# Patient Record
Sex: Female | Born: 1995 | Race: White | Hispanic: No | Marital: Single | Smoking: Former smoker
Health system: Southern US, Community
[De-identification: ages and names within clinical notes are randomized; demographics above are authoritative.]

## PROBLEM LIST (undated history)

## (undated) ENCOUNTER — Inpatient Hospital Stay (HOSPITAL_COMMUNITY): Payer: Self-pay

## (undated) DIAGNOSIS — Z789 Other specified health status: Secondary | ICD-10-CM

## (undated) HISTORY — PX: NO PAST SURGERIES: SHX2092

---

## 2015-03-29 ENCOUNTER — Encounter: Payer: Self-pay | Admitting: Family Medicine

## 2015-03-29 ENCOUNTER — Ambulatory Visit (INDEPENDENT_AMBULATORY_CARE_PROVIDER_SITE_OTHER): Payer: 59 | Admitting: Family Medicine

## 2015-03-29 ENCOUNTER — Telehealth: Payer: Self-pay | Admitting: Family Medicine

## 2015-03-29 VITALS — BP 115/76 | HR 58 | Ht 64.0 in | Wt 198.0 lb

## 2015-03-29 DIAGNOSIS — R1013 Epigastric pain: Secondary | ICD-10-CM | POA: Diagnosis not present

## 2015-03-29 DIAGNOSIS — R1011 Right upper quadrant pain: Secondary | ICD-10-CM | POA: Diagnosis not present

## 2015-03-29 MED ORDER — ESOMEPRAZOLE MAGNESIUM 20 MG PO PACK: 20.0000 mg | PACK | Freq: Every day | ORAL | Status: DC

## 2015-03-29 MED ORDER — GI COCKTAIL ~~LOC~~
30.0000 mL | Freq: Once | ORAL | Status: AC
  Administered 2015-03-29: 30 mL via ORAL

## 2015-03-29 NOTE — Telephone Encounter (Signed)
[name], For some reason I can't see any records from [facility] through care everywhere on this patient.  Can you please see if [facility] can send us imaging and lab results from her visit to the ED in March.  Patient states that she also goes by "Abigail Trevino".

## 2015-03-29 NOTE — Progress Notes (Signed)
CC: Abigail Trevino is a 19 y.o. female is here for Establish Care and abdominal pain for years   Subjective: HPI:  Pleasant 19 year old here to establish care  Complains of abdominal pain that has been present for many years of her life however over the past month it's become much more unbearable. It fluctuates from mild to severe in severity over the course of the day. It's improved with avoiding eating and drinking. It's worse with bending forward and with food but not particularly with respect to the type of food. It's localized in the epigastric region and radiates into the back. It's described as a twisting sensation. Used to get better with Tums however this is becoming ineffective. Pain awoke her from her sleep at 2 AM last week and she went to a local emergency room she tells me she had a CT scan and blood work all of which was normal. She was also given Carafate that has mildly helped her pain. Hydrocodone helps her pain as well. No other interventions other than above. Review of systems is positive for a reflux sensation, vomiting, nausea and flushing.  Review of Systems - General ROS: negative for - chills, fever, night sweats, weight gain or weight loss Ophthalmic ROS: negative for - decreased vision Psychological ROS: negative for - anxiety or depression ENT ROS: negative for - hearing change, nasal congestion, tinnitus or allergies Hematological and Lymphatic ROS: negative for - bleeding problems, bruising or swollen lymph nodes Breast ROS: negative Respiratory ROS: no cough, shortness of breath, or wheezing Cardiovascular ROS: no chest pain or dyspnea on exertion Gastrointestinal ROS: no change in bowel habits, or black or bloody stools Genito-Urinary ROS: negative for - genital discharge, genital ulcers, incontinence or abnormal bleeding from genitals Musculoskeletal ROS: negative for - joint pain or muscle pain Neurological ROS: negative for - headaches or memory  loss Dermatological ROS: negative for lumps, mole changes, rash and skin lesion changes  History reviewed. No pertinent past medical history.  History reviewed. No pertinent past surgical history. Family History  Problem Relation Age of Onset  . Alcohol abuse      grandfather   . Lymphoma      grandfather     History   Social History  . Marital Status: Single    Spouse Name: N/A  . Number of Children: N/A  . Years of Education: N/A   Occupational History  . Not on file.   Social History Main Topics  . Smoking status: Current Every Day Smoker -- 0.50 packs/day for 2 years    Types: Cigarettes  . Smokeless tobacco: Not on file  . Alcohol Use: 0.6 oz/week    1 Standard drinks or equivalent per week  . Drug Use: Yes  . Sexual Activity:    Partners: Male   Other Topics Concern  . Not on file   Social History Narrative  . No narrative on file     Objective: BP 115/76 mmHg  Pulse 58  Ht  (1.626 m)  Wt 198 lb (89.812 kg)  BMI 33.97 kg/m2  LMP 03/25/2015  General: Alert and Oriented, No Acute Distress HEENT: Pupils equal, round, reactive to light. Conjunctivae clear.  Moist mucous membranes Lungs: Clear to auscultation bilaterally, no wheezing/ronchi/rales.  Comfortable work of breathing. Good air movement. Cardiac: Regular rate and rhythm. Normal S1/S2.  No murmurs, rubs, nor gallops.   Abdomen:obese and soft without guarding or rebound tenderness. No palpable masses. Pain is reproduced with palpation of the epigastric  region. No palpable hepatosplenomegaly. No umbilical hernia Extremities: No peripheral edema.  Strong peripheral pulses.  Mental Status: No depression, anxiety, nor agitation. Skin: Warm and dry.  Assessment & Plan: Abigail SparVictoria was seen today for establish care and abdominal pain for years.  Diagnoses and all orders for this visit:  Abdominal pain, epigastric Orders: -     esomeprazole (NEXIUM) 20 MG packet; Take 20 mg by mouth daily before  breakfast. -     gi cocktail (Maalox,Lidocaine,Donnatal); Take 30 mLs by mouth once.  RUQ pain Orders: -     US Abdomen Complete; Future -     gi cocktail (Maalox,Lidocaine,Donnatal); Take 30 mLs by mouth once.   My suspicion is that she suffered from gastritis, peptic ulcer disease or cholecystitis. I gave her a GI cocktail that I've instructed her to take at home when she feels that her pain is worsening.if this improves her pain within a few minutes then differential would point more to gastritis or ulcer, if not helpful she will then need to have an ultrasound of the gallbladder. If GI cocktail is helpful she can then start Nexium daily, samples were provided.  Return if symptoms worsen or fail to improve.

## 2015-03-30 ENCOUNTER — Ambulatory Visit (INDEPENDENT_AMBULATORY_CARE_PROVIDER_SITE_OTHER): Payer: 59

## 2015-03-30 DIAGNOSIS — R1013 Epigastric pain: Secondary | ICD-10-CM | POA: Diagnosis not present

## 2015-03-30 DIAGNOSIS — R1011 Right upper quadrant pain: Secondary | ICD-10-CM

## 2015-03-30 NOTE — Telephone Encounter (Signed)
Pt's mother states the gi cocktail did not help

## 2015-03-30 NOTE — Telephone Encounter (Signed)
Also can you ask her if she used the GI cocktail and whether or not it influenced her pain?

## 2015-03-30 NOTE — Telephone Encounter (Signed)
Noted, no change to the plan from result note for her US.

## 2016-02-07 IMAGING — US US ABDOMEN COMPLETE
1 series · 14 of 25 positions shown · non-contrast
Comparison: None.

CLINICAL DATA: Epigastric and right upper quadrant pain for the
past 2 weeks associated with nausea vomiting and diarrhea; symptoms
worse postprandially

EXAM:
ULTRASOUND ABDOMEN COMPLETE

[Series 1: us abdomen complete · 0.27mm/px · 14 of 159 slices shown]
[im 1/159]
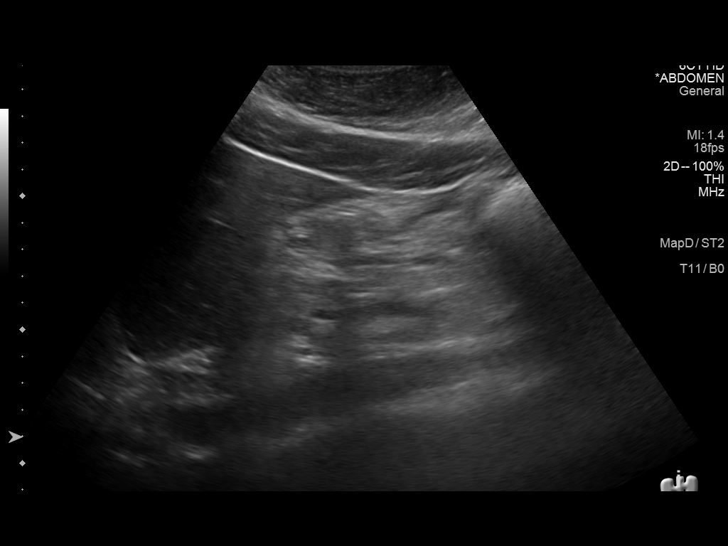
[im 14/159]
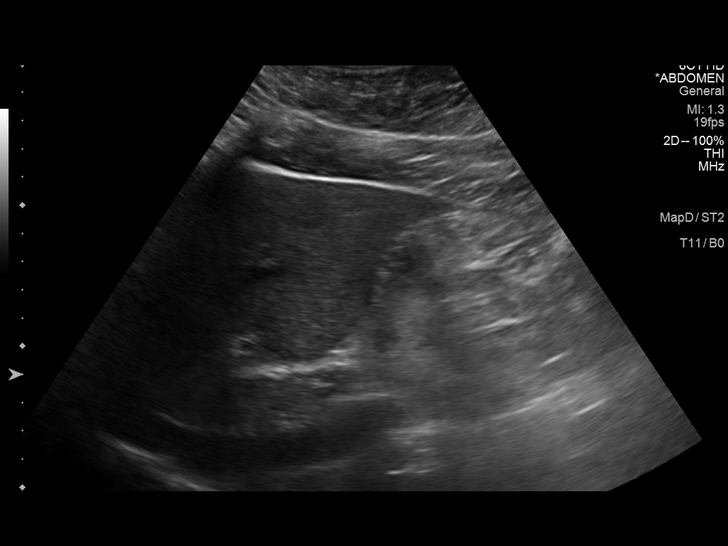
[im 27/159]
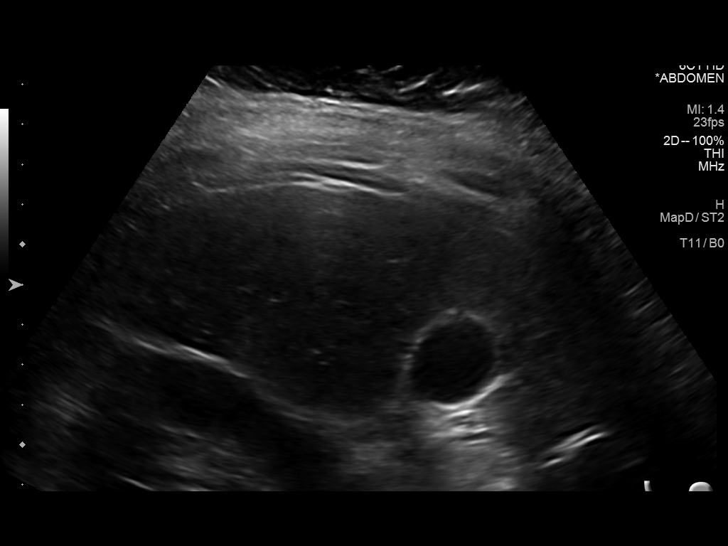
[im 40/159]
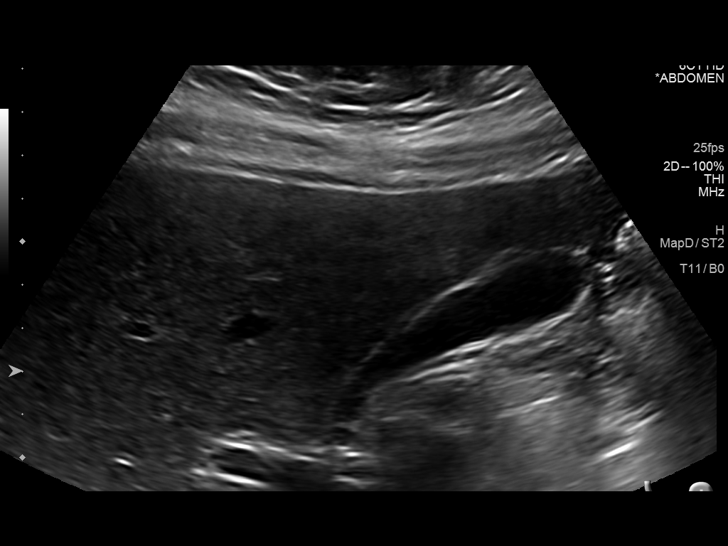
[im 53/159]
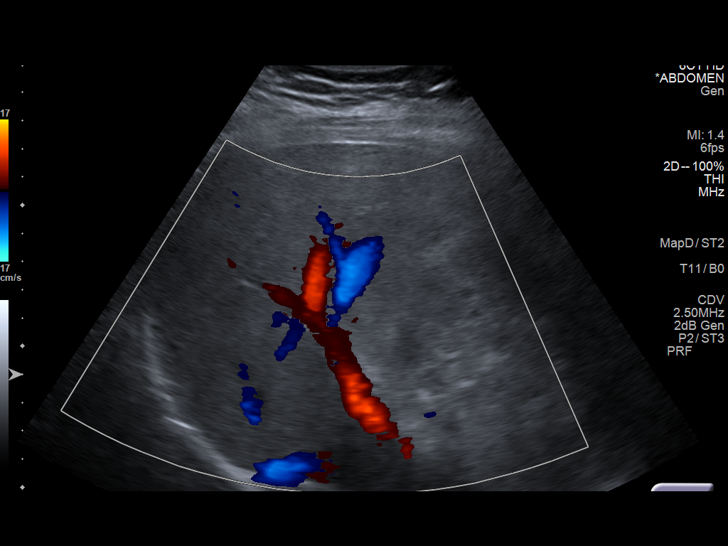
[im 60/159]
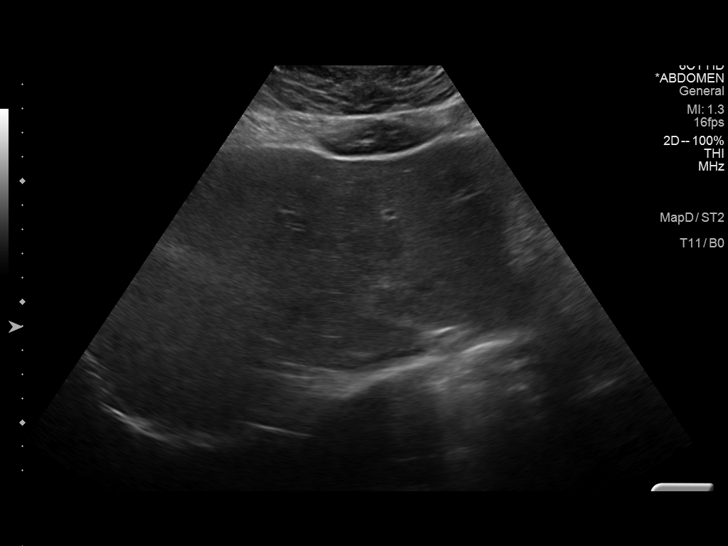
[im 73/159]
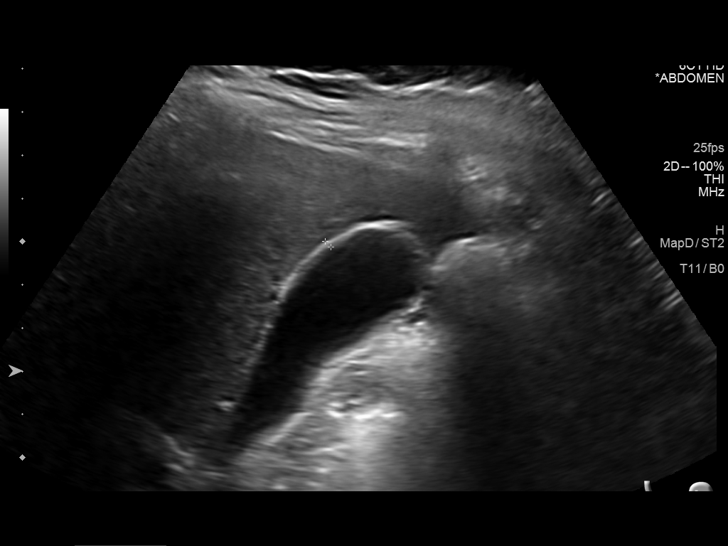
[im 86/159]
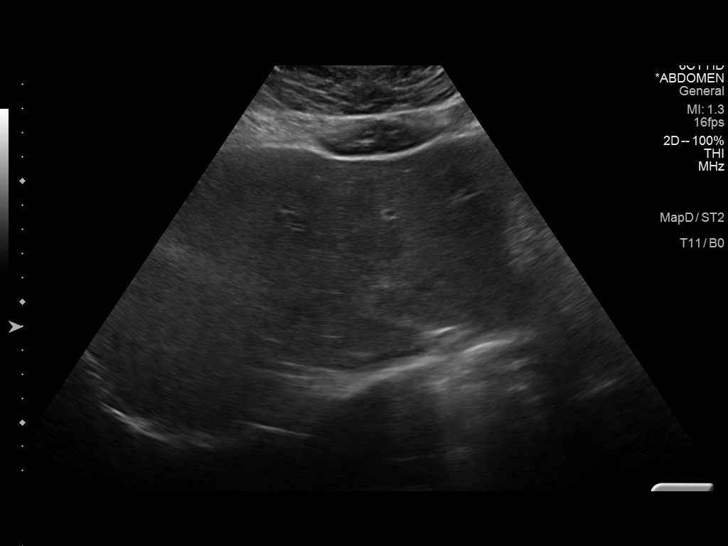
[im 99/159]
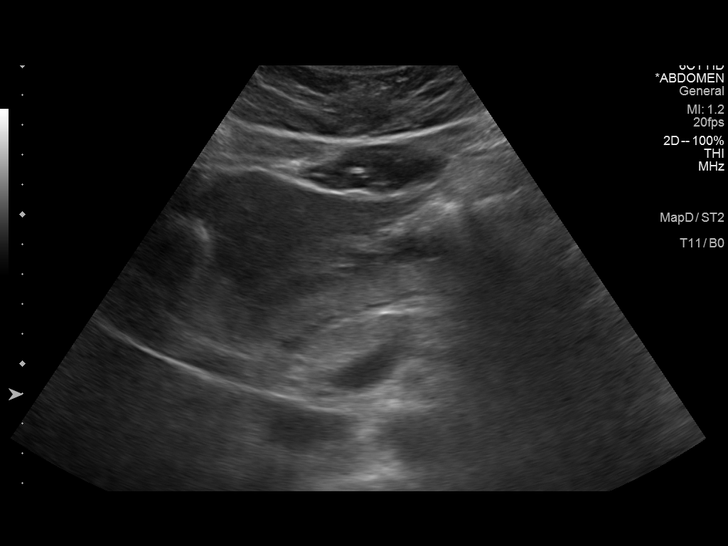
[im 106/159]
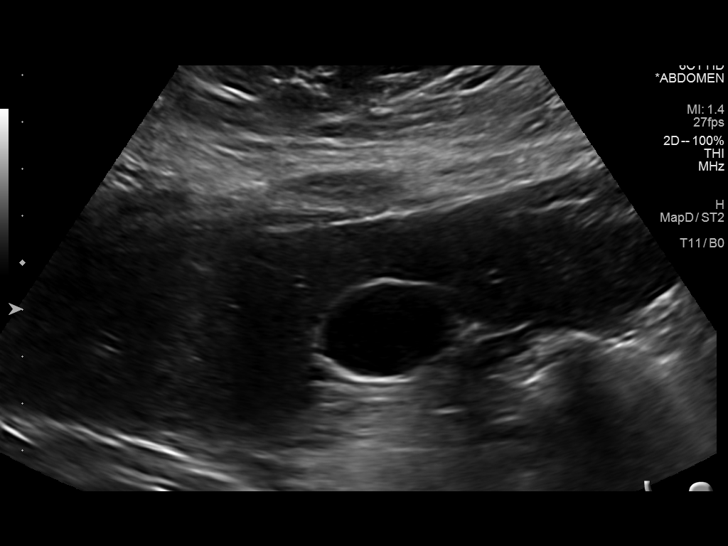
[im 119/159]
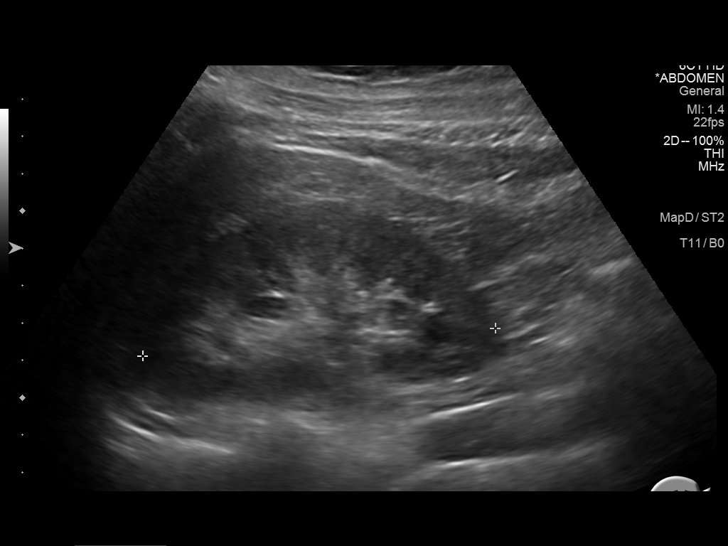
[im 132/159]
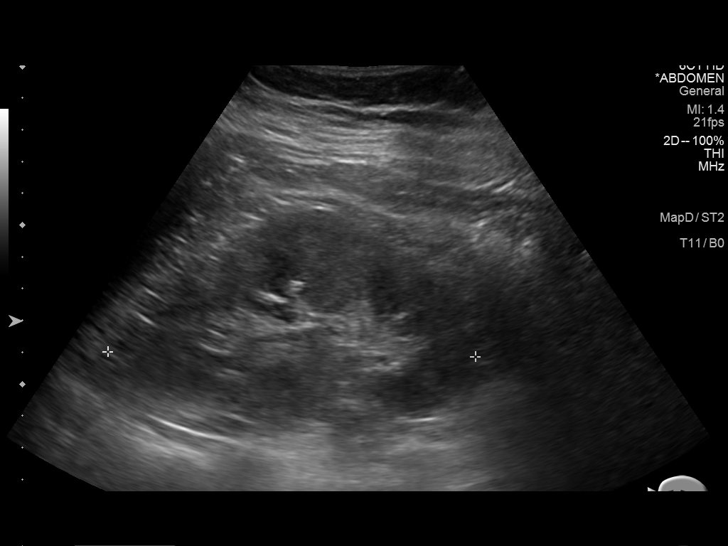
[im 145/159]
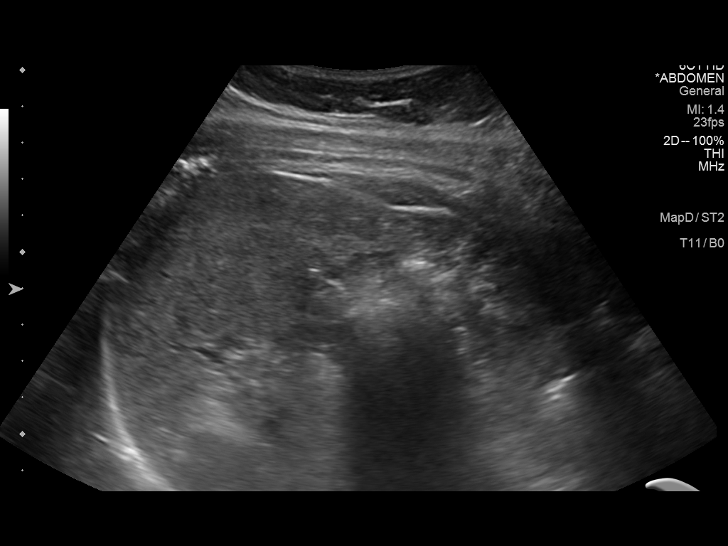
[im 159/159]
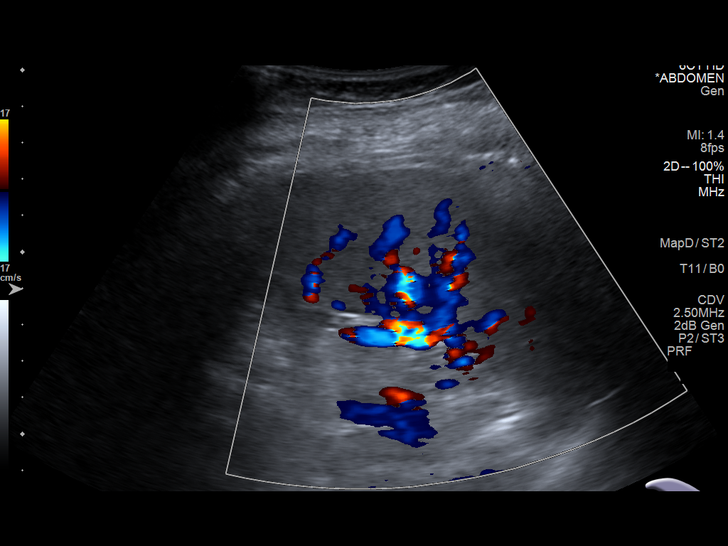

[14 of 25 positions shown; findings below may reference images not displayed]

FINDINGS: Gallbladder: No gallstones or wall thickening visualized. No
sonographic Murphy sign noted.

Common bile duct: Diameter: 2.4 mm

Liver: No focal lesion identified. Within normal limits in
parenchymal echogenicity. There is no intrahepatic ductal dilation.

IVC: Visualization is limited by bowel gas.

Pancreas: Only a small portion of the pancreatic body was visible
due to bowel gas.

Spleen: Size and appearance within normal limits.

Right Kidney: Length: 9.5 cm. Echogenicity within normal limits. No
mass or hydronephrosis visualized.

Left Kidney: Length: 11.6 cm. Echogenicity within normal limits. No
mass or hydronephrosis visualized.

Abdominal aorta: Limited visualization of the abdominal aorta
revealed no aneurysm.

Other findings: There is no ascites.
IMPRESSION: Overall the study is limited due to bowel gas. No acute abnormality
of the liver, gallbladder, or common bile duct is demonstrated.
Evaluation of the pancreas is limited.

## 2016-05-24 DIAGNOSIS — Z3401 Encounter for supervision of normal first pregnancy, first trimester: Secondary | ICD-10-CM | POA: Insufficient documentation

## 2016-09-29 LAB — OB RESULTS CONSOLE GC/CHLAMYDIA
CHLAMYDIA, DNA PROBE: NEGATIVE
GC PROBE AMP, GENITAL: NEGATIVE

## 2016-09-29 LAB — OB RESULTS CONSOLE RUBELLA ANTIBODY, IGM: Rubella: IMMUNE

## 2016-09-29 LAB — OB RESULTS CONSOLE ABO/RH: RH Type: POSITIVE

## 2016-09-29 LAB — OB RESULTS CONSOLE HEPATITIS B SURFACE ANTIGEN: Hepatitis B Surface Ag: NEGATIVE

## 2016-09-29 LAB — OB RESULTS CONSOLE HIV ANTIBODY (ROUTINE TESTING): HIV: NONREACTIVE

## 2016-09-29 LAB — OB RESULTS CONSOLE RPR: RPR: NONREACTIVE

## 2016-09-29 LAB — OB RESULTS CONSOLE ANTIBODY SCREEN: ANTIBODY SCREEN: NEGATIVE

## 2016-11-18 ENCOUNTER — Inpatient Hospital Stay (HOSPITAL_COMMUNITY)
Admission: AD | Admit: 2016-11-18 | Discharge: 2016-11-18 | Disposition: A | Discharge status: Home / Self Care | Payer: Medicaid Other | Source: Ambulatory Visit | Attending: Obstetrics and Gynecology | Admitting: Obstetrics and Gynecology

## 2016-11-18 ENCOUNTER — Encounter (HOSPITAL_COMMUNITY): Payer: Self-pay | Admitting: *Deleted

## 2016-11-18 DIAGNOSIS — R102 Pelvic and perineal pain: Secondary | ICD-10-CM

## 2016-11-18 DIAGNOSIS — R1031 Right lower quadrant pain: Secondary | ICD-10-CM | POA: Insufficient documentation

## 2016-11-18 DIAGNOSIS — Z3A31 31 weeks gestation of pregnancy: Secondary | ICD-10-CM | POA: Insufficient documentation

## 2016-11-18 DIAGNOSIS — O26899 Other specified pregnancy related conditions, unspecified trimester: Secondary | ICD-10-CM

## 2016-11-18 DIAGNOSIS — Z87891 Personal history of nicotine dependence: Secondary | ICD-10-CM | POA: Insufficient documentation

## 2016-11-18 DIAGNOSIS — O26893 Other specified pregnancy related conditions, third trimester: Secondary | ICD-10-CM | POA: Insufficient documentation

## 2016-11-18 HISTORY — DX: Other specified health status: Z78.9

## 2016-11-18 LAB — URINE MICROSCOPIC-ADD ON

## 2016-11-18 LAB — URINALYSIS, ROUTINE W REFLEX MICROSCOPIC
Bilirubin Urine: NEGATIVE
GLUCOSE, UA: NEGATIVE mg/dL
HGB URINE DIPSTICK: NEGATIVE
Ketones, ur: NEGATIVE mg/dL
Nitrite: NEGATIVE
PH: 6 (ref 5.0–8.0)
Protein, ur: NEGATIVE mg/dL
SPECIFIC GRAVITY, URINE: 1.025 (ref 1.005–1.030)

## 2016-11-18 MED ORDER — COMFORT FIT MATERNITY SUPP SM MISC: 1.0000 [IU] | Freq: Every day | 0 refills | Status: DC | PRN

## 2016-11-18 NOTE — Discharge Instructions (Signed)
Round Ligament Pain Introduction The round ligament is a cord of muscle and tissue that helps to support the uterus. It can become a source of pain during pregnancy if it becomes stretched or twisted as the baby grows. The pain usually begins in the second trimester of pregnancy, and it can come and go until the baby is delivered. It is not a serious problem, and it does not cause harm to the baby. Round ligament pain is usually a short, sharp, and pinching pain, but it can also be a dull, lingering, and aching pain. The pain is felt in the lower side of the abdomen or in the groin. It usually starts deep in the groin and moves up to the outside of the hip area. Pain can occur with:  A sudden change in position.  Rolling over in bed.  Coughing or sneezing.  Physical activity. Follow these instructions at home: Watch your condition for any changes. Take these steps to help with your pain:  When the pain starts, relax. Then try:  Sitting down.  Flexing your knees up to your abdomen.  Lying on your side with one pillow under your abdomen and another pillow between your legs.  Sitting in a warm bath for 15-20 minutes or until the pain goes away.  Take over-the-counter and prescription medicines only as told by your health care provider.  Move slowly when you sit and stand.  Avoid long walks if they cause pain.  Stop or lessen your physical activities if they cause pain. Contact a health care provider if:  Your pain does not go away with treatment.  You feel pain in your back that you did not have before.  Your medicine is not helping. Get help right away if:  You develop a fever or chills.  You develop uterine contractions.  You develop vaginal bleeding.  You develop nausea or vomiting.  You develop diarrhea.  You have pain when you urinate. This information is not intended to replace advice given to you by your health care provider. Make sure you discuss any questions  you have with your health care provider. Document Released: 09/19/2008 Document Revised: 05/18/2016 Document Reviewed: 02/17/2015  2017 Elsevier  

## 2016-11-18 NOTE — MAU Note (Signed)
having sharp pains in RLQ. No fever, diarrhea or vomiting.  Was working, happened suddenly, didn't go away after an hour. When she sits is ok, but when she stands or walks it hurts.

## 2016-11-18 NOTE — MAU Provider Note (Signed)
History     CSN: [id number]  Arrival date and time: 11/18/16 1150   First Provider Initiated Contact with Patient 11/18/16 1302        Chief Complaint  Patient presents with  . RLQ pain   HPI  Abigail Trevino is a 20 y.o. G1P0 at 2893w5d who presents with abdominal pain. Pain began this morning while at work. 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 Reports RLQ pain that is constant and sharp. Pain is improved when lying back & worsened with standing, walking, and changing positions. Rates pain 6/10. Has not treated. Denies vaginal bleeding, n/v/d, constipation, dysuria, LOF, or fever. Positive fetal movement.   OB History    Gravida Para Term Preterm AB Living   1             SAB TAB Ectopic Multiple Live Births                  Past Medical History:  Diagnosis Date  . Medical history non-contributory     Past Surgical History:  Procedure Laterality Date  . NO PAST SURGERIES      Family History  Problem Relation Age of Onset  . Alcohol abuse      grandfather   . Lymphoma      grandfather     Social History  Substance Use Topics  . Smoking status: Former Smoker    Packs/day: 0.50    Years: 2.00    Types: Cigarettes  . Smokeless tobacco: Never Used  . Alcohol use 0.6 oz/week    1 Standard drinks or equivalent per week    Allergies: No Known Allergies  Prescriptions Prior to Admission  Medication Sig Dispense Refill Last Dose  . Prenatal MV-Min-FA-Omega-3 (PRENATAL GUMMIES/DHA & FA) 0.4-32.5 MG CHEW Chew 2 capsules by mouth daily.   11/18/2016 at Unknown time    Review of Systems  Constitutional: Negative.   Gastrointestinal: Positive for abdominal pain. Negative for constipation, diarrhea, nausea and vomiting.  Genitourinary: Negative.   Musculoskeletal: Negative for back pain and falls.   Physical Exam   Blood pressure 111/64, pulse 78, temperature 98.1 F (36.7 C), temperature source Oral, resp. rate 18, weight 210 lb 9.6 oz  (95.5 kg).  Physical Exam  Nursing note and vitals reviewed. Constitutional: She is oriented to person, place, and time. She appears well-developed and well-nourished. No distress.  HENT:  Head: Normocephalic and atraumatic.  Eyes: Conjunctivae are normal. Right eye exhibits no discharge. Left eye exhibits no discharge. No scleral icterus.  Neck: Normal range of motion.  Cardiovascular: Normal rate, regular rhythm and normal heart sounds.   No murmur heard. Respiratory: Effort normal and breath sounds normal. No respiratory distress. She has no wheezes.  GI: Soft. Bowel sounds are normal. There is no tenderness.  Neurological: She is alert and oriented to person, place, and time.  Skin: Skin is warm and dry. She is not diaphoretic.  Psychiatric: She has a normal mood and affect. Her behavior is normal. Judgment and thought content normal.   Dilation: Closed Exam by:: Judeth HornErin Lashonda Sonneborn, NP  Fetal Tracing:  Baseline: 125 Variability: moderate Accelerations: 15x15 Decelerations: none   Toco: irregular ctx & some UI  MAU Course  Procedures Results for orders placed or performed during the hospital encounter of 11/18/16 (from the past 24 hour(s))  Urinalysis, Routine w reflex microscopic (not at [facility])     Status: Abnormal   Collection Time: 11/18/16 12:06 PM  Result Value  Ref Range   Color, Urine YELLOW YELLOW   APPearance CLEAR CLEAR   Specific Gravity, Urine 1.025 1.005 - 1.030   pH 6.0 5.0 - 8.0   Glucose, UA NEGATIVE NEGATIVE mg/dL   Hgb urine dipstick NEGATIVE NEGATIVE   Bilirubin Urine NEGATIVE NEGATIVE   Ketones, ur NEGATIVE NEGATIVE mg/dL   Protein, ur NEGATIVE NEGATIVE mg/dL   Nitrite NEGATIVE NEGATIVE   Leukocytes, UA SMALL (A) NEGATIVE  Urine microscopic-add on     Status: Abnormal   Collection Time: 11/18/16 12:06 PM  Result Value Ref Range   Squamous Epithelial / LPF 6-30 (A) NONE SEEN   WBC, UA 0-5 0 - 5 WBC/hpf   RBC / HPF 0-5 0 - 5 RBC/hpf   Bacteria, UA  MANY (A) NONE SEEN    MDM Category 1 tracing Cervix closed U/a - contaminated specimen S/w Dr. Tenny Crawoss; ok to discharge home  Assessment and Plan  A; 1. Pain of round ligament during pregnancy    P: Discharge home Rx maternity support belt Discussed reasons to return to MAU Keep f/u with OB or call if symptoms worsen  Judeth Hornrin Zen Cedillos 11/18/2016, 1:01 PM

## 2016-12-15 ENCOUNTER — Encounter: Payer: Self-pay | Admitting: Family

## 2016-12-15 ENCOUNTER — Ambulatory Visit (INDEPENDENT_AMBULATORY_CARE_PROVIDER_SITE_OTHER): Payer: Medicaid Other | Admitting: Family

## 2016-12-15 VITALS — BP 120/77 | HR 81 | Wt 212.0 lb

## 2016-12-15 DIAGNOSIS — Z3403 Encounter for supervision of normal first pregnancy, third trimester: Secondary | ICD-10-CM

## 2016-12-15 DIAGNOSIS — Z3400 Encounter for supervision of normal first pregnancy, unspecified trimester: Secondary | ICD-10-CM | POA: Insufficient documentation

## 2016-12-15 LAB — OB RESULTS CONSOLE GBS: STREP GROUP B AG: POSITIVE

## 2016-12-15 NOTE — Patient Instructions (Signed)
AREA PEDIATRIC/FAMILY PRACTICE PHYSICIANS  [facility] [address], [address] [place], [place]  [address] Phone - [phone]   Fax - [phone]  [facility] [address] [place], [place] [address] Phone - [phone]   Fax - [phone]  [facility] [address] [place], [place]  [address] Phone - [phone]   Fax - [phone]  [facility] [address] [place], [place]  [address] Phone - [phone]   Fax - [phone]  [facility] [address] [place], [place]  [address] Phone - [phone]   Fax - [phone]  [facility] [address], [address] [place], [place]  [address] Phone - [phone]   Fax - [phone]  [facility] [address] [place], [place]  [address] Phone - [phone]   Fax - [phone]  [facility] [address] [place], [place]  [address] Phone - [phone]   Fax - [phone]  [facility] [address] [place], [place]  [address] Phone - [phone]   Fax - [phone] [facility] [address] [place], [place]  [address] Phone - [phone]   Fax - [phone]  [facility] [address] [place], [place]  [address] Phone - [phone]   Fax - [phone]  [facility] [address] [place], [place]  [address] Phone - [phone]   Fax - [phone]  [facility] [address], [address] [place], [place]  [address] Phone - [phone]   Fax - [phone]  [facility] [address] [place], [place]  [address] Phone - [phone]  [facility] [address] [place], [place]  [address] Phone - [phone]   Fax - [phone]  [facility] [address] [place], [place]  [address] Phone - [phone]   Fax - [phone]  [facility] [address] [place], [place]  [address] Phone - [phone]   Fax - [phone]  [facility] [address] [place], [place]  [address] Phone - [phone]   Fax - [phone]   [facility] [address] [place], [place]  [address] Phone - [phone]   Fax - [phone]  [facility] [address] [place], [place]  [address] Phone - [phone]   Fax - [phone]  [facility] [address] [place], [place]  [address] Phone - [phone]   Fax - [phone]  [facility] [address] [place], [place]  [address] Phone - [phone]   Fax - [phone]  [facility] [address] , [place]  [address] Phone - [phone]   Fax - [phone] [facility] [address] [place], [place]  [address] Phone - [phone]   Fax - [phone]  [facility] [address] [place],   [address] Phone - [phone]   Fax - [phone]  [facility] [address] [place],   [address] Phone - [phone]   Fax - [phone]   

## 2016-12-15 NOTE — Progress Notes (Signed)
  Subjective:    Abigail Trevino is a G1P0 1728w4d being seen today for her first obstetrical visit.  Her obstetrical history is significant for first time pregnancy with no complications.  [facility].  Received adequate care and all testing.  Attended waterbirth class last week. Patient does intend to breast feed. Pregnancy history fully reviewed.  Patient reports intermittent pressure.  Vitals:   12/15/16 0815  BP: 120/77  Pulse: 81  Weight: 212 lb (96.2 kg)    HISTORY: OB History  Gravida Para Term Preterm AB Living  1            SAB TAB Ectopic Multiple Live Births               # Outcome Date GA Lbr Len/2nd Weight Sex Delivery Anes PTL Lv  1 Current              Past Medical History:  Diagnosis Date  . Medical history non-contributory    Past Surgical History:  Procedure Laterality Date  . NO PAST SURGERIES     Family History  Problem Relation Age of Onset  . Alcohol abuse      grandfather   . Lymphoma      grandfather   . Fibromyalgia Maternal Grandmother   . Lymphoma Maternal Grandfather   . Heart disease Maternal Grandfather      Exam   Vitals:   12/15/16 0815  BP: 120/77  Pulse: 81  Weight: 212 lb (96.2 kg)    Fetal Status: Fetal Heart Rate (bpm): 143   Movement: Present     General:  Alert, oriented and cooperative. Patient is in no acute distress.  Skin: Skin is warm and dry. No rash noted.   Cardiovascular: Normal heart rate noted  Respiratory: Normal respiratory effort, no problems with respiration noted  Abdomen: Soft, gravid, appropriate for gestational age. Pain/Pressure: Present     Pelvic: Vag. Bleeding: None Vag D/C Character: White   Cervical exam performed      closed/thick  Extremities: Normal range of motion.  Edema: Trace  Mental Status: Normal mood and affect. Normal behavior. Normal judgment and thought content.   Urinalysis:       Assessment:    Pregnancy: G1P0 Patient  Active Problem List   Diagnosis Date Noted  . Supervision of low-risk first pregnancy 12/15/2016        Plan:     Prenatal records reviewed.  GBS and GC/CT collected Signed waterbirth consent today and reviewed contraindications if developed later in pregnancy including the following:  Preterm birth less than 37 weeks, thick, particulate meconium stained fluid, Maternal fever over 101, heavy bleeding or signs of placental abruption, pre-eclampsia, abnormal fetal heart rate pattern, breech presentation, active communicable infection (this does NOT include group B strep), significant limitation to mobility, or any other condition per provider discretion. Prenatal vitamins. Problem list reviewed and updated.  Follow up in 1 weeks.  Eino FarberWalidah Elenora FenderKarim 12/15/2016

## 2016-12-15 NOTE — Addendum Note (Signed)
Addended by: Kathie DikeSOLA, Ryan Palermo J on: 12/15/2016 09:48 AM   Modules accepted: Orders

## 2016-12-16 LAB — CULTURE, BETA STREP (GROUP B ONLY)

## 2016-12-22 ENCOUNTER — Encounter: Payer: Self-pay | Admitting: Advanced Practice Midwife

## 2016-12-22 ENCOUNTER — Ambulatory Visit (INDEPENDENT_AMBULATORY_CARE_PROVIDER_SITE_OTHER): Payer: Medicaid Other | Admitting: Advanced Practice Midwife

## 2016-12-22 ENCOUNTER — Other Ambulatory Visit (HOSPITAL_COMMUNITY)
Admission: RE | Admit: 2016-12-22 | Discharge: 2016-12-22 | Disposition: A | Discharge status: Home / Self Care | Payer: Medicaid Other | Source: Ambulatory Visit | Attending: Advanced Practice Midwife | Admitting: Advanced Practice Midwife

## 2016-12-22 VITALS — BP 110/72 | HR 84 | Wt 214.0 lb

## 2016-12-22 DIAGNOSIS — Z3403 Encounter for supervision of normal first pregnancy, third trimester: Secondary | ICD-10-CM

## 2016-12-22 DIAGNOSIS — Z3492 Encounter for supervision of normal pregnancy, unspecified, second trimester: Secondary | ICD-10-CM

## 2016-12-22 DIAGNOSIS — Z113 Encounter for screening for infections with a predominantly sexual mode of transmission: Secondary | ICD-10-CM | POA: Insufficient documentation

## 2016-12-22 DIAGNOSIS — O99820 Streptococcus B carrier state complicating pregnancy: Secondary | ICD-10-CM | POA: Insufficient documentation

## 2016-12-22 NOTE — Patient Instructions (Signed)

## 2016-12-22 NOTE — Progress Notes (Signed)
   PRENATAL VISIT NOTE  Subjective:  Abigail Trevino is a 20 y.o. G1P0 at 3077w4d being seen today for ongoing prenatal care.  She is currently monitored for the following issues for this low-risk pregnancy and has Supervision of low-risk first pregnancy; Prenatal care, first pregnancy in first trimester; and Group B streptococcal carriage complicating pregnancy on her problem list.  Patient reports no complaints.  Contractions: Irritability. Vag. Bleeding: None.  Movement: Present. Denies leaking of fluid.   The following portions of the patient's history were reviewed and updated as appropriate: allergies, current medications, past family history, past medical history, past social history, past surgical history and problem list. Problem list updated.  Objective:   Vitals:   12/22/16 0823  BP: 110/72  Pulse: 84  Weight: 214 lb (97.1 kg)    Fetal Status: Fetal Heart Rate (bpm): 143   Movement: Present     General:  Alert, oriented and cooperative. Patient is in no acute distress.  Skin: Skin is warm and dry. No rash noted.   Cardiovascular: Normal heart rate noted  Respiratory: Normal respiratory effort, no problems with respiration noted  Abdomen: Soft, gravid, appropriate for gestational age. Pain/Pressure: Present     Pelvic:  Cervical exam deferred        Extremities: Normal range of motion.  Edema: Trace  Mental Status: Normal mood and affect. Normal behavior. Normal judgment and thought content.   Assessment and Plan:  Pregnancy: G1P0 at 3577w4d  1. Normal pregnancy in second trimester      Reviewed waterbirth info. Has had class but certificate not in system, scanned today  - Urine cytology ancillary only  2. Group B streptococcal carriage complicating pregnancy     Plan PCN in labor, pt notified  Term labor symptoms and general obstetric precautions including but not limited to vaginal bleeding, contractions, leaking of fluid and fetal movement were reviewed in detail  with the patient. Please refer to After Visit Summary for other counseling recommendations.  Return in about 1 week (around 12/29/2016) [place] [facility] [facility].   Aviva SignsMarie L Williams, CNM

## 2016-12-25 NOTE — L&D Delivery Note (Signed)
Operative Delivery Note At 9:16 AM a viable female was delivered via Vaginal, Spontaneous Delivery after 1 minute shoulder dystocia.  Presentation: vertex; Position: Occiput,, Anterior  Delivery of the head: 01/17/2017  9:15 AM First maneuver: McRoberts Second maneuver: Suprapubic Pressure Third maneuver:  Woodscrew Fourth maneuver: Reverse Woodscrew Fifth maneuver:  N/A Sixth maneuver: N/A  Total time of shoulder dystocia: 1 minute  APGAR: 7, 9; weight is pending.   Placenta status: Intact, 3VC, spontaneously delivered Cord: 3 vessel, with the following complications: None.   Cord pH: 7.279  Anesthesia:   Epidural Episiotomy: None Lacerations: Vaginal;Labial Suture Repair: 3.0 vicryl 4.0 Monocryl Est. Blood Loss (mL): 250  Mom to postpartum.  Baby to Couplet care / Skin to Skin.  Jen MowElizabeth Tan Clopper, DO OB Fellow 01/17/2017, 10:37 AM

## 2016-12-26 LAB — URINE CYTOLOGY ANCILLARY ONLY
Chlamydia: NEGATIVE
NEISSERIA GONORRHEA: NEGATIVE

## 2016-12-29 ENCOUNTER — Ambulatory Visit (INDEPENDENT_AMBULATORY_CARE_PROVIDER_SITE_OTHER): Payer: Medicaid Other | Admitting: Family

## 2016-12-29 ENCOUNTER — Encounter: Payer: Self-pay | Admitting: Family

## 2016-12-29 VITALS — BP 121/77 | HR 79 | Wt 219.0 lb

## 2016-12-29 DIAGNOSIS — Z3403 Encounter for supervision of normal first pregnancy, third trimester: Secondary | ICD-10-CM

## 2016-12-29 NOTE — Progress Notes (Signed)
   PRENATAL VISIT NOTE  Subjective:  Levert FeinsteinVictoria Trevino is a 21 y.o. G1P0 at 733w4d being seen today for ongoing prenatal care.  She is currently monitored for the following issues for this low-risk pregnancy and has Supervision of low-risk first pregnancy; Prenatal care, first pregnancy in first trimester; and Group B streptococcal carriage complicating pregnancy on her problem list.  Patient reports no complaints.  Contractions: Irritability. Vag. Bleeding: None.  Movement: Present. Denies leaking of fluid.   The following portions of the patient's history were reviewed and updated as appropriate: allergies, current medications, past family history, past medical history, past social history, past surgical history and problem list. Problem list updated.  Objective:   Vitals:   12/29/16 0818  BP: 121/77  Pulse: 79  Weight: 219 lb (99.3 kg)    Fetal Status: Fetal Heart Rate (bpm): 140   Movement: Present     General:  Alert, oriented and cooperative. Patient is in no acute distress.  Skin: Skin is warm and dry. No rash noted.   Cardiovascular: Normal heart rate noted  Respiratory: Normal respiratory effort, no problems with respiration noted  Abdomen: Soft, gravid, appropriate for gestational age. Pain/Pressure: Present     Pelvic:  Cervical exam deferred        Extremities: Normal range of motion.  Edema: Trace  Mental Status: Normal mood and affect. Normal behavior. Normal judgment and thought content.   Assessment and Plan:  Pregnancy: G1P0 at 573w4d  1. Encounter for supervision of low-risk first pregnancy in third trimester - Reviewed GBS results - Discussed signs of labor - Discussed stages of cervical change (softning, effacement, dilation)  Term labor symptoms and general obstetric precautions including but not limited to vaginal bleeding, contractions, leaking of fluid and fetal movement were reviewed in detail with the patient. Please refer to After Visit Summary for  other counseling recommendations.  Return in about 1 week (around 01/05/2017).   Eino FarberWalidah Kennith GainN Karim, CNM

## 2017-01-03 ENCOUNTER — Ambulatory Visit (INDEPENDENT_AMBULATORY_CARE_PROVIDER_SITE_OTHER): Payer: Medicaid Other | Admitting: Obstetrics & Gynecology

## 2017-01-03 DIAGNOSIS — Z3403 Encounter for supervision of normal first pregnancy, third trimester: Secondary | ICD-10-CM

## 2017-01-03 NOTE — Progress Notes (Signed)
   PRENATAL VISIT NOTE  Subjective:  Abigail Trevino is a 21 y.o. G1P0 at 910w2d being seen today for ongoing prenatal care.  She is currently monitored for the following issues for this low-risk pregnancy and has Supervision of low-risk first pregnancy; Prenatal care, first pregnancy in first trimester; and Group B streptococcal carriage complicating pregnancy on her problem list.  Patient reports no complaints.  Contractions: Irritability. Vag. Bleeding: None.  Movement: Present. Denies leaking of fluid.   The following portions of the patient's history were reviewed and updated as appropriate: allergies, current medications, past family history, past medical history, past social history, past surgical history and problem list. Problem list updated.  Objective:   Vitals:   01/03/17 1014  BP: 122/79  Pulse: 93  Weight: 220 lb (99.8 kg)    Fetal Status: Fetal Heart Rate (bpm): 143 Fundal Height: 38 cm Movement: Present     General:  Alert, oriented and cooperative. Patient is in no acute distress.  Skin: Skin is warm and dry. No rash noted.   Cardiovascular: Normal heart rate noted  Respiratory: Normal respiratory effort, no problems with respiration noted  Abdomen: Soft, gravid, appropriate for gestational age. Pain/Pressure: Present     Pelvic:  Cervical exam performed Dilation: Closed Effacement (%): 50 Station: Ballotable  Extremities: Normal range of motion.  Edema: Trace  Mental Status: Normal mood and affect. Normal behavior. Normal judgment and thought content.   Assessment and Plan:  Pregnancy: G1P0 at 3510w2d  1. Encounter for supervision of low-risk first pregnancy in third trimester -GBS positive--treat in labor  Term labor symptoms and general obstetric precautions including but not limited to vaginal bleeding, contractions, leaking of fluid and fetal movement were reviewed in detail with the patient. Please refer to After Visit Summary for other counseling  recommendations.   RTC 1 week.  Lesly DukesKelly H Geron Mulford, MD

## 2017-01-05 ENCOUNTER — Encounter: Payer: Medicaid Other | Admitting: Advanced Practice Midwife

## 2017-01-10 ENCOUNTER — Encounter: Payer: Medicaid Other | Admitting: Obstetrics & Gynecology

## 2017-01-15 ENCOUNTER — Inpatient Hospital Stay (HOSPITAL_COMMUNITY)
Admission: AD | Admit: 2017-01-15 | Discharge: 2017-01-18 | DRG: 775 | Disposition: A | Discharge status: Home / Self Care | Payer: Medicaid Other | Source: Ambulatory Visit | Attending: Family Medicine | Admitting: Family Medicine

## 2017-01-15 ENCOUNTER — Ambulatory Visit (INDEPENDENT_AMBULATORY_CARE_PROVIDER_SITE_OTHER): Payer: Medicaid Other | Admitting: Obstetrics & Gynecology

## 2017-01-15 ENCOUNTER — Inpatient Hospital Stay (HOSPITAL_COMMUNITY): Payer: Medicaid Other

## 2017-01-15 ENCOUNTER — Encounter: Payer: Self-pay | Admitting: Obstetrics & Gynecology

## 2017-01-15 ENCOUNTER — Encounter (HOSPITAL_COMMUNITY): Payer: Self-pay | Admitting: *Deleted

## 2017-01-15 VITALS — BP 116/77 | HR 88 | Wt 223.0 lb

## 2017-01-15 DIAGNOSIS — Z3403 Encounter for supervision of normal first pregnancy, third trimester: Secondary | ICD-10-CM

## 2017-01-15 DIAGNOSIS — Z8249 Family history of ischemic heart disease and other diseases of the circulatory system: Secondary | ICD-10-CM

## 2017-01-15 DIAGNOSIS — Z87891 Personal history of nicotine dependence: Secondary | ICD-10-CM

## 2017-01-15 DIAGNOSIS — O99824 Streptococcus B carrier state complicating childbirth: Secondary | ICD-10-CM | POA: Diagnosis present

## 2017-01-15 DIAGNOSIS — O368390 Maternal care for abnormalities of the fetal heart rate or rhythm, unspecified trimester, not applicable or unspecified: Secondary | ICD-10-CM

## 2017-01-15 DIAGNOSIS — Z3A40 40 weeks gestation of pregnancy: Secondary | ICD-10-CM | POA: Diagnosis not present

## 2017-01-15 DIAGNOSIS — O288 Other abnormal findings on antenatal screening of mother: Secondary | ICD-10-CM

## 2017-01-15 LAB — CBC
HEMATOCRIT: 34.5 % — AB (ref 36.0–46.0)
HEMOGLOBIN: 11.6 g/dL — AB (ref 12.0–15.0)
MCH: 28.1 pg (ref 26.0–34.0)
MCHC: 33.6 g/dL (ref 30.0–36.0)
MCV: 83.5 fL (ref 78.0–100.0)
Platelets: 226 10*3/uL (ref 150–400)
RBC: 4.13 MIL/uL (ref 3.87–5.11)
RDW: 14.3 % (ref 11.5–15.5)
WBC: 8.9 10*3/uL (ref 4.0–10.5)

## 2017-01-15 LAB — TYPE AND SCREEN
ABO/RH(D): O POS
ANTIBODY SCREEN: NEGATIVE

## 2017-01-15 MED ORDER — TERBUTALINE SULFATE 1 MG/ML IJ SOLN
0.2500 mg | Freq: Once | INTRAMUSCULAR | Status: DC | PRN
  Filled 2017-01-15: qty 1

## 2017-01-15 MED ORDER — ACETAMINOPHEN 325 MG PO TABS: 650.0000 mg | ORAL_TABLET | ORAL | Status: DC | PRN

## 2017-01-15 MED ORDER — OXYTOCIN 40 UNITS IN LACTATED RINGERS INFUSION - SIMPLE MED: 2.5000 [IU]/h | INTRAVENOUS | Status: DC

## 2017-01-15 MED ORDER — PENICILLIN G POTASSIUM 5000000 UNITS IJ SOLR
5.0000 10*6.[IU] | Freq: Once | INTRAVENOUS | Status: AC
  Administered 2017-01-16: 5 10*6.[IU] via INTRAVENOUS
  Filled 2017-01-15: qty 5

## 2017-01-15 MED ORDER — OXYTOCIN 40 UNITS IN LACTATED RINGERS INFUSION - SIMPLE MED
1.0000 m[IU]/min | INTRAVENOUS | Status: DC
  Administered 2017-01-15: 1 m[IU]/min via INTRAVENOUS
  Administered 2017-01-16: 6 m[IU]/min via INTRAVENOUS
  Filled 2017-01-15 (×2): qty 1000

## 2017-01-15 MED ORDER — FENTANYL CITRATE (PF) 100 MCG/2ML IJ SOLN
100.0000 ug | INTRAMUSCULAR | Status: DC | PRN
  Administered 2017-01-15 – 2017-01-16 (×13): 100 ug via INTRAVENOUS
  Filled 2017-01-15 (×15): qty 2

## 2017-01-15 MED ORDER — LACTATED RINGERS IV SOLN
INTRAVENOUS | Status: DC
  Administered 2017-01-16 – 2017-01-17 (×3): via INTRAVENOUS

## 2017-01-15 MED ORDER — SOD CITRATE-CITRIC ACID 500-334 MG/5ML PO SOLN
30.0000 mL | ORAL | Status: DC | PRN
  Administered 2017-01-16 (×2): 30 mL via ORAL
  Filled 2017-01-15 (×2): qty 15

## 2017-01-15 MED ORDER — PENICILLIN G POT IN DEXTROSE 60000 UNIT/ML IV SOLN
3.0000 10*6.[IU] | INTRAVENOUS | Status: DC
  Administered 2017-01-16 – 2017-01-17 (×5): 3 10*6.[IU] via INTRAVENOUS
  Filled 2017-01-15 (×8): qty 50

## 2017-01-15 MED ORDER — LIDOCAINE HCL (PF) 1 % IJ SOLN
30.0000 mL | INTRAMUSCULAR | Status: DC | PRN
  Administered 2017-01-17: 30 mL via SUBCUTANEOUS
  Filled 2017-01-15: qty 30

## 2017-01-15 MED ORDER — LACTATED RINGERS IV SOLN: 500.0000 mL | INTRAVENOUS | Status: DC | PRN

## 2017-01-15 MED ORDER — OXYCODONE-ACETAMINOPHEN 5-325 MG PO TABS: 2.0000 | ORAL_TABLET | ORAL | Status: DC | PRN

## 2017-01-15 MED ORDER — OXYTOCIN BOLUS FROM INFUSION
500.0000 mL | Freq: Once | INTRAVENOUS | Status: AC
Start: 2017-01-15 — End: 2017-01-17
  Administered 2017-01-17: 500 mL via INTRAVENOUS

## 2017-01-15 MED ORDER — OXYCODONE-ACETAMINOPHEN 5-325 MG PO TABS: 1.0000 | ORAL_TABLET | ORAL | Status: DC | PRN

## 2017-01-15 MED ORDER — MISOPROSTOL 25 MCG QUARTER TABLET: 25.0000 ug | ORAL_TABLET | ORAL | Status: DC | PRN

## 2017-01-15 MED ORDER — ONDANSETRON HCL 4 MG/2ML IJ SOLN
4.0000 mg | Freq: Four times a day (QID) | INTRAMUSCULAR | Status: DC | PRN
Start: 2017-01-15 — End: 2017-01-17

## 2017-01-15 NOTE — H&P (Signed)
Abigail FeinsteinVictoria Trevino is a 21 y.o. female G1P0 at 7363w0d who presents to maternity admissions sent from the office for prolonged decleration on NST.  She had 6/8 BPP and another deceleration noted during ultrasound.  She is admitted for IOL for nonreactive NST.    She has no other risk factors and no pregnancy complications.  She reports good fetal movement, denies regular contractions, LOF, vaginal bleeding, vaginal itching/burning, urinary symptoms, h/a, dizziness, n/v, or fever/chills.    She planned waterbirth but is aware that she cannot get in tub if continuous monitoring is required.  Clinic  [place] > transfer f/[place] for waterbirth at 35 wks Prenatal Labs  Dating 16 wk ultrasound Blood type:   O+  Genetic Screen Quad nml Antibody: Neg  Anatomic US  nml Rubella:  Immune  GTT Early:               Third trimester: 98 RPR:   NR  Flu vaccine Declined HBsAg:   Negative  TDaP vaccine  Declined                                            Rhogam: HIV:   Nonreactive  Baby Food  Breast                                           GBS: (For PCN allergy, check sensitivities) Positive  Contraception  Considering IUD - Mirena  Pap:  Normal Oct 2017; GC/CT negx2  Circumcision  Outpatient   Pediatrician  Given list 12/15/16   Support Person  Harrison MonsBlake     OB History    Gravida Para Term Preterm AB Living   1             SAB TAB Ectopic Multiple Live Births                 Past Medical History:  Diagnosis Date  . Medical history non-contributory    Past Surgical History:  Procedure Laterality Date  . NO PAST SURGERIES     Family History: family history includes Fibromyalgia in her maternal grandmother; Heart disease in her maternal grandfather; Lymphoma in her maternal grandfather. Social History:  reports that she has quit smoking. Her smoking use included Cigarettes. She has a 1.00 pack-year smoking history. She has never used smokeless tobacco. She reports that she does not drink  alcohol or use drugs.     Maternal Diabetes: No Genetic Screening: Normal Maternal Ultrasounds/Referrals: Normal Fetal Ultrasounds or other Referrals:  None Maternal Substance Abuse:  No Significant Maternal Medications:  None Significant Maternal Lab Results:  Lab values include: Group B Strep positive Other Comments:  None  Review of Systems  Constitutional: Negative for chills, fever and malaise/fatigue.  Eyes: Negative for blurred vision.  Respiratory: Negative for cough and shortness of breath.   Cardiovascular: Negative for chest pain.  Gastrointestinal: Negative for abdominal pain, heartburn and vomiting.  Genitourinary: Negative for dysuria, frequency and urgency.  Musculoskeletal: Negative.   Neurological: Negative for dizziness and headaches.  Psychiatric/Behavioral: Negative for depression.   Maternal Medical History:  Reason for admission: nonreactive NST  Contractions: Onset was 3-5 hours ago.   Frequency: irregular.   Perceived severity is mild.    Fetal activity: Perceived fetal activity  is normal.   Last perceived fetal movement was within the past hour.    Prenatal complications: no prenatal complications Prenatal Complications - Diabetes: none.      Blood pressure 114/80, pulse 92, temperature 97.7 F (36.5 C), temperature source Oral, resp. rate 18. Maternal Exam:  Uterine Assessment: Contraction strength is mild.  Contraction frequency is rare.      Fetal Exam Fetal Monitor Review: Mode: ultrasound.   Baseline rate: 135.  Variability: moderate (6-25 bpm).   Pattern: accelerations present and prolonged decelerations.    Fetal State Assessment: Category II - tracings are indeterminate.     Physical Exam  Nursing note and vitals reviewed. Constitutional: She is oriented to person, place, and time. She appears well-developed and well-nourished.  Neck: Normal range of motion.  Cardiovascular: Normal rate, regular rhythm and normal heart  sounds.   Respiratory: Effort normal and breath sounds normal.  GI: Soft.  Musculoskeletal: Normal range of motion.  Neurological: She is alert and oriented to person, place, and time.  Skin: Skin is warm and dry.  Psychiatric: She has a normal mood and affect. Her behavior is normal. Judgment and thought content normal.    Prenatal labs: ABO, Rh:  O pos Antibody:  neg Rubella:  Immune RPR:   NR HBsAg:   Neg HIV:   Nonreactive GBS:   Positive  Assessment/Plan: 1. NST (non-stress test) nonreactive   2. Encounter for supervision of low-risk first pregnancy in third trimester   3. Variable fetal heart rate decelerations, antepartum     Admit to [facility] for IOL Anticipate NSVD    Sharen Counter 01/15/2017, 6:46 PM

## 2017-01-15 NOTE — Progress Notes (Signed)
IV not functioning properly.  Port used for pitocin is not accepting fluid.  IV tubing has been changed.  Main line runs on and off pump.  Pitocin now running @ 631mu/min

## 2017-01-15 NOTE — Anesthesia Pain Management Evaluation Note (Signed)
  CRNA Pain Management Visit Note  Patient: Abigail Trevino, 21 y.o., female  "Hello I am a [facility] of the [facility] team [facility]. We have an anesthesia team available at all times to provide care throughout the hospital, including epidural management and anesthesia for C-section. I don't know your plan for the delivery whether it a natural birth, water birth, IV sedation, nitrous supplementation, doula or epidural, but we want to meet your pain goals."   1.Was your pain managed to your expectations on prior hospitalizations?   No prior hospitalizations  2.What is your expectation for pain management during this hospitalization?     Epidural, IV pain meds and Nitrous Oxide  3.How can we help you reach that goal? Be available  Record the patient's initial score and the patient's pain goal.   Pain: 0  Pain Goal: 5 [facility] wants you to be able to say your pain was always managed very well.  [facility] IncMERRITT,Abigail Foulkes 01/15/2017

## 2017-01-15 NOTE — MAU Note (Signed)
Pt sent to MAU from MD office, had ? Drop in FHR in office.  Has occasional braxton hicks uc's, denies bleeding or LOF.

## 2017-01-15 NOTE — Progress Notes (Signed)
Patient seen. Doing well. Foley placed. Will start low dose pitocin in conjunction with foley given that patient has had decels and 6/10 BPP. Plan expect vaginal delivery. Advised laboring in the tub was out as she had desired a water birth, but may be able to deliver depending on how labor goes.

## 2017-01-15 NOTE — Progress Notes (Signed)
   PRENATAL VISIT NOTE  Subjective:  Abigail Trevino is a 21 y.o. G1P0 at 269w0d being seen today for ongoing prenatal care.  She is currently monitored for the following issues for this low-risk pregnancy and has Supervision of low-risk first pregnancy; Prenatal care, first pregnancy in first trimester; and Group B streptococcal carriage complicating pregnancy on her problem list.  Patient reports no complaints.  Contractions: Irregular. Vag. Bleeding: None.  Movement: Present. Denies leaking of fluid.   The following portions of the patient's history were reviewed and updated as appropriate: allergies, current medications, past family history, past medical history, past social history, past surgical history and problem list. Problem list updated.  Objective:   Vitals:   01/15/17 1448  BP: 116/77  Pulse: 88  Weight: 223 lb (101.2 kg)    Fetal Status:     Movement: Present     General:  Alert, oriented and cooperative. Patient is in no acute distress.  Skin: Skin is warm and dry. No rash noted.   Cardiovascular: Normal heart rate noted  Respiratory: Normal respiratory effort, no problems with respiration noted  Abdomen: Soft, gravid, appropriate for gestational age. Pain/Pressure: Present     Pelvic:  Cervical exam performed        Extremities: Normal range of motion.  Edema: Trace  Mental Status: Normal mood and affect. Normal behavior. Normal judgment and thought content.   Assessment and Plan:  Pregnancy: G1P0 at 819w0d  There are no diagnoses linked to this encounter. Term labor symptoms and general obstetric precautions including but not limited to vaginal bleeding, contractions, leaking of fluid and fetal movement were reviewed in detail with the patient. Please refer to After Visit Summary for other counseling recommendations.  No Follow-up on file.   To MAU for BPP  Allie BossierMyra C Lilyan Prete, MD

## 2017-01-16 ENCOUNTER — Inpatient Hospital Stay (HOSPITAL_COMMUNITY): Payer: Medicaid Other | Admitting: Anesthesiology

## 2017-01-16 LAB — RPR: RPR Ser Ql: NONREACTIVE

## 2017-01-16 LAB — ABO/RH: ABO/RH(D): O POS

## 2017-01-16 MED ORDER — LACTATED RINGERS IV SOLN
500.0000 mL | Freq: Once | INTRAVENOUS | Status: AC
  Administered 2017-01-16: 500 mL via INTRAVENOUS

## 2017-01-16 MED ORDER — LACTATED RINGERS IV SOLN: 500.0000 mL | Freq: Once | INTRAVENOUS | Status: DC

## 2017-01-16 MED ORDER — LIDOCAINE HCL (PF) 1 % IJ SOLN
INTRAMUSCULAR | Status: DC | PRN
  Administered 2017-01-16: 2 mL
  Administered 2017-01-16: 5 mL
  Administered 2017-01-16: 3 mL

## 2017-01-16 MED ORDER — DIPHENHYDRAMINE HCL 50 MG/ML IJ SOLN: 12.5000 mg | INTRAMUSCULAR | Status: DC | PRN

## 2017-01-16 MED ORDER — EPHEDRINE 5 MG/ML INJ
10.0000 mg | INTRAVENOUS | Status: DC | PRN
  Filled 2017-01-16: qty 4

## 2017-01-16 MED ORDER — PHENYLEPHRINE 40 MCG/ML (10ML) SYRINGE FOR IV PUSH (FOR BLOOD PRESSURE SUPPORT)
80.0000 ug | PREFILLED_SYRINGE | INTRAVENOUS | Status: DC | PRN
  Filled 2017-01-16: qty 5
  Filled 2017-01-16: qty 10

## 2017-01-16 MED ORDER — EPHEDRINE 5 MG/ML INJ: 10.0000 mg | INTRAVENOUS | Status: DC | PRN

## 2017-01-16 MED ORDER — FENTANYL 2.5 MCG/ML BUPIVACAINE 1/10 % EPIDURAL INFUSION (WH - ANES)
14.0000 mL/h | INTRAMUSCULAR | Status: DC | PRN
  Administered 2017-01-16: 12 mL/h via EPIDURAL
  Administered 2017-01-17: 14 mL/h via EPIDURAL
  Filled 2017-01-16 (×2): qty 100

## 2017-01-16 MED ORDER — PHENYLEPHRINE 40 MCG/ML (10ML) SYRINGE FOR IV PUSH (FOR BLOOD PRESSURE SUPPORT)
80.0000 ug | PREFILLED_SYRINGE | INTRAVENOUS | Status: DC | PRN
Start: 2017-01-16 — End: 2017-01-17
  Filled 2017-01-16: qty 5

## 2017-01-16 MED ORDER — PHENYLEPHRINE 40 MCG/ML (10ML) SYRINGE FOR IV PUSH (FOR BLOOD PRESSURE SUPPORT): 80.0000 ug | PREFILLED_SYRINGE | INTRAVENOUS | Status: DC | PRN

## 2017-01-16 NOTE — Anesthesia Preprocedure Evaluation (Signed)
Anesthesia Evaluation  Patient identified by MRN, date of birth, ID band Patient awake    Reviewed: Allergy & Precautions, Patient's Chart, lab work & pertinent test results  Airway Mallampati: II       Dental   Pulmonary former smoker,    Pulmonary exam normal        Cardiovascular negative cardio ROS Normal cardiovascular exam     Neuro/Psych negative neurological ROS     GI/Hepatic negative GI ROS, Neg liver ROS,   Endo/Other  negative endocrine ROS  Renal/GU negative Renal ROS     Musculoskeletal   Abdominal   Peds  Hematology negative hematology ROS (+)   Anesthesia Other Findings   Reproductive/Obstetrics (+) Pregnancy                             Lab Results  Component Value Date   WBC 8.9 01/15/2017   HGB 11.6 (L) 01/15/2017   HCT 34.5 (L) 01/15/2017   MCV 83.5 01/15/2017   PLT 226 01/15/2017    Anesthesia Physical Anesthesia Plan  ASA: II  Anesthesia Plan:    Post-op Pain Management:    Induction:   Airway Management Planned: Natural Airway  Additional Equipment:   Intra-op Plan:   Post-operative Plan:   Informed Consent: I have reviewed the patients History and Physical, chart, labs and discussed the procedure including the risks, benefits and alternatives for the proposed anesthesia with the patient or authorized representative who has indicated his/her understanding and acceptance.     Plan Discussed with:   Anesthesia Plan Comments:         Anesthesia Quick Evaluation

## 2017-01-16 NOTE — Progress Notes (Signed)
S: Patient seen & examined for progress of labor. Patient is really starting to feel contractions. She is trying to decide whether she would still like to proceed with water birth. At this time, the patient is leaning towards abandoning the idea. She is considering an epidural for pain control.     O:  Vitals:   01/16/17 1745 01/16/17 1746 01/16/17 1801 01/16/17 1902  BP:  124/83 114/74 103/86  Pulse:  67 68 86  Resp: 18 (!) 22 (!) 22   Temp:  98.2 F (36.8 C)    TempSrc:  Oral    Weight:      Height:        Dilation: 5.5 Effacement (%): 70 Cervical Position: Posterior Station: -2 Presentation: Vertex Exam by:: Ferne CoeS Earl RNC   FHT: 140 bpm, mod var, +accels, no decels TOCO: Irregular pattern detected on monitor, patient is feeling them regularly every couple of minutes   A/P: Patient wants to try an additional dose of fentanyl. She is considering an epidural for pain control.  Still debating water birth idea, but is leaning away from the idea at this time given the she is undergoing an induction Currently on pitocin Continue expectant management otherwise Anticipate SVD   Gorden HarmsMegan Daesia Zylka, MD PGY-2 01/16/2017 7:10 PM

## 2017-01-16 NOTE — Progress Notes (Signed)
S: Patient seen & examined for progress of labor. Patient feeling contractions. She does not desire an epidural.    O:  Vitals:   01/16/17 1302 01/16/17 1331 01/16/17 1537 01/16/17 1630  BP: (!) 134/94 138/81    Pulse: 93 87    Resp: 20 20 18 16   Temp:   98 F (36.7 C)   TempSrc:   Oral   Weight:      Height:        Dilation: 6 Effacement (%): 80 Cervical Position: Posterior Station: -2 Presentation: Vertex Exam by:: Foye ClockS. Oklesh RN   FHT: 130s bpm, mod var, +accels, early decels TOCO: irregular contraction pattern   A/P: Foley bulb has come out, continuing on pitocin as tolerated by FHT Continue expectant management Anticipate SVD   Gorden HarmsMegan Christinia Lambeth, MD PGY-2 01/16/2017 4:40 PM

## 2017-01-16 NOTE — Anesthesia Procedure Notes (Signed)
Epidural Patient location during procedure: OB Start time: 01/16/2017 8:11 PM End time: 01/16/2017 8:21 PM  Staffing Anesthesiologist: Marcene DuosFITZGERALD, Quinlan Mcfall Performed: anesthesiologist   Preanesthetic Checklist Completed: patient identified, site marked, surgical consent, pre-op evaluation, timeout performed, IV checked, risks and benefits discussed and monitors and equipment checked  Epidural Patient position: sitting Prep: site prepped and draped and DuraPrep Patient monitoring: continuous pulse ox and blood pressure Approach: midline Location: L4-L5 Injection technique: LOR air  Needle:  Needle type: Tuohy  Needle gauge: 17 G Needle length: 9 cm and 9 Needle insertion depth: 7 cm Catheter type: closed end flexible Catheter size: 19 Gauge Catheter at skin depth: 12 cm Test dose: negative  Assessment Events: blood not aspirated, injection not painful, no injection resistance, negative IV test and no paresthesia

## 2017-01-17 ENCOUNTER — Encounter (HOSPITAL_COMMUNITY): Payer: Self-pay | Admitting: *Deleted

## 2017-01-17 DIAGNOSIS — Z3A40 40 weeks gestation of pregnancy: Secondary | ICD-10-CM

## 2017-01-17 MED ORDER — TETANUS-DIPHTH-ACELL PERTUSSIS 5-2.5-18.5 LF-MCG/0.5 IM SUSP: 0.5000 mL | Freq: Once | INTRAMUSCULAR | Status: DC

## 2017-01-17 MED ORDER — SODIUM CHLORIDE 0.9% FLUSH: 3.0000 mL | INTRAVENOUS | Status: DC | PRN

## 2017-01-17 MED ORDER — OXYTOCIN 40 UNITS IN LACTATED RINGERS INFUSION - SIMPLE MED: 2.5000 [IU]/h | INTRAVENOUS | Status: DC | PRN

## 2017-01-17 MED ORDER — WITCH HAZEL-GLYCERIN EX PADS: 1.0000 "application " | MEDICATED_PAD | CUTANEOUS | Status: DC | PRN

## 2017-01-17 MED ORDER — SIMETHICONE 80 MG PO CHEW: 80.0000 mg | CHEWABLE_TABLET | ORAL | Status: DC | PRN

## 2017-01-17 MED ORDER — LACTATED RINGERS IV SOLN
INTRAVENOUS | Status: DC
  Administered 2017-01-17: 05:00:00 via INTRAUTERINE

## 2017-01-17 MED ORDER — ONDANSETRON HCL 4 MG PO TABS: 4.0000 mg | ORAL_TABLET | ORAL | Status: DC | PRN

## 2017-01-17 MED ORDER — SODIUM CHLORIDE 0.9% FLUSH: 3.0000 mL | Freq: Two times a day (BID) | INTRAVENOUS | Status: DC

## 2017-01-17 MED ORDER — SODIUM CHLORIDE 0.9 % IV SOLN: 250.0000 mL | INTRAVENOUS | Status: DC | PRN

## 2017-01-17 MED ORDER — SENNOSIDES-DOCUSATE SODIUM 8.6-50 MG PO TABS
2.0000 | ORAL_TABLET | ORAL | Status: DC
  Administered 2017-01-17: 2 via ORAL
  Filled 2017-01-17: qty 2

## 2017-01-17 MED ORDER — ONDANSETRON HCL 4 MG/2ML IJ SOLN: 4.0000 mg | INTRAMUSCULAR | Status: DC | PRN

## 2017-01-17 MED ORDER — COCONUT OIL OIL: 1.0000 "application " | TOPICAL_OIL | Status: DC | PRN

## 2017-01-17 MED ORDER — PRENATAL MULTIVITAMIN CH
1.0000 | ORAL_TABLET | Freq: Every day | ORAL | Status: DC
  Administered 2017-01-18: 1 via ORAL
  Filled 2017-01-17: qty 1

## 2017-01-17 MED ORDER — BENZOCAINE-MENTHOL 20-0.5 % EX AERO
1.0000 "application " | INHALATION_SPRAY | CUTANEOUS | Status: DC | PRN
  Filled 2017-01-17: qty 56

## 2017-01-17 MED ORDER — ZOLPIDEM TARTRATE 5 MG PO TABS: 5.0000 mg | ORAL_TABLET | Freq: Every evening | ORAL | Status: DC | PRN

## 2017-01-17 MED ORDER — DIBUCAINE 1 % RE OINT
1.0000 "application " | TOPICAL_OINTMENT | RECTAL | Status: DC | PRN
  Administered 2017-01-17: 1 via RECTAL
  Filled 2017-01-17: qty 28

## 2017-01-17 MED ORDER — ACETAMINOPHEN 325 MG PO TABS
650.0000 mg | ORAL_TABLET | ORAL | Status: DC | PRN
  Administered 2017-01-18: 650 mg via ORAL
  Filled 2017-01-17: qty 2

## 2017-01-17 MED ORDER — IBUPROFEN 600 MG PO TABS
600.0000 mg | ORAL_TABLET | Freq: Four times a day (QID) | ORAL | Status: DC
  Administered 2017-01-17 – 2017-01-18 (×6): 600 mg via ORAL
  Filled 2017-01-17 (×6): qty 1

## 2017-01-17 MED ORDER — DIPHENHYDRAMINE HCL 25 MG PO CAPS: 25.0000 mg | ORAL_CAPSULE | Freq: Four times a day (QID) | ORAL | Status: DC | PRN

## 2017-01-17 NOTE — Lactation Note (Signed)
This note was copied from a baby's chart. Lactation Consultation Note Initial visit at 12 hours of age.  Mom reports baby has been sleepy for feedings.  Mom reports seeing colostrum and knows baby is getting some.  Mom holding baby asleep now.  Mom reports + breast changes during pregnancy and is knowledgeable about basics of breastfeeding. Mom denies questions or concerns at this time.  LC deferred breast assessment at this visit due to visitors at bedside.  Mom encouraged to call for assist with latching as needed. [facility] resources given and discussed.  Encouraged to feed with early cues on demand.  Early newborn behavior discussed.  Mom to call for assist as needed.    Patient Name: Abigail Levert FeinsteinVictoria Forsberg [id number]'s Date: 01/17/2017 Reason for consult: Initial assessment   Maternal Data Has patient been taught Hand Expression?: Yes Does the patient have breastfeeding experience prior to this delivery?: No  Feeding Feeding Type: Breast Fed Length of feed: 1 min  LATCH Score/Interventions Latch: Too sleepy or reluctant, no latch achieved, no sucking elicited. Intervention(s): Skin to skin Intervention(s): Adjust position;Assist with latch  Audible Swallowing: None Intervention(s): Skin to skin;Hand expression  Type of Nipple: Flat Intervention(s): No intervention needed  Comfort (Breast/Nipple): Soft / non-tender     Hold (Positioning): Assistance needed to correctly position infant at breast and maintain latch. Intervention(s): Breastfeeding basics reviewed;Support Pillows;Position options;Skin to skin  LATCH Score: 4  Lactation Tools Discussed/Used WIC Program: Yes   Consult Status Consult Status: Follow-up Date: 01/18/17 Follow-up type: In-patient    Jannifer RodneyShoptaw, Jana Lynn 01/17/2017, 10:02 PM

## 2017-01-17 NOTE — Anesthesia Postprocedure Evaluation (Addendum)
Anesthesia Post Note  Patient: TurkeyVictoria Cozby  Procedure(s) Performed: * No procedures listed *  Patient location during evaluation: Mother Baby Anesthesia Type: Epidural Level of consciousness: awake, awake and alert, oriented and patient cooperative Pain management: pain level controlled Vital Signs Assessment: post-procedure vital signs reviewed and stable Respiratory status: spontaneous breathing, nonlabored ventilation and respiratory function stable Cardiovascular status: stable Postop Assessment: no headache, no backache, patient able to bend at knees and no signs of nausea or vomiting Anesthetic complications: no        Last Vitals:  Vitals:   01/17/17 1215 01/17/17 1300  BP: 113/70 111/80  Pulse: 65 84  Resp: 18 18  Temp: 36.8 C 36.7 C    Last Pain:  Vitals:   01/17/17 1336  TempSrc:   PainSc: 4    Pain Goal: Patients Stated Pain Goal: 3 (01/17/17 0622)               CARVER,ALISON L

## 2017-01-18 ENCOUNTER — Ambulatory Visit: Payer: Self-pay

## 2017-01-18 LAB — CBC
HEMATOCRIT: 26.2 % — AB (ref 36.0–46.0)
HEMOGLOBIN: 9.3 g/dL — AB (ref 12.0–15.0)
MCH: 29.4 pg (ref 26.0–34.0)
MCHC: 35.5 g/dL (ref 30.0–36.0)
MCV: 82.9 fL (ref 78.0–100.0)
Platelets: 183 10*3/uL (ref 150–400)
RBC: 3.16 MIL/uL — ABNORMAL LOW (ref 3.87–5.11)
RDW: 14.6 % (ref 11.5–15.5)
WBC: 12.3 10*3/uL — ABNORMAL HIGH (ref 4.0–10.5)

## 2017-01-18 MED ORDER — IBUPROFEN 600 MG PO TABS: 600.0000 mg | ORAL_TABLET | Freq: Four times a day (QID) | ORAL | 0 refills | Status: AC

## 2017-01-18 MED ORDER — SENNOSIDES-DOCUSATE SODIUM 8.6-50 MG PO TABS: 2.0000 | ORAL_TABLET | ORAL | 0 refills | Status: DC

## 2017-01-18 NOTE — Progress Notes (Signed)
UR chart review completed.  

## 2017-01-18 NOTE — Discharge Instructions (Signed)

## 2017-01-18 NOTE — Lactation Note (Signed)
This note was copied from a baby's chart. Lactation Consultation Note  Patient Name: Abigail Levert FeinsteinVictoria Sneed [id number]'BToday's Date: 01/18/2017 Reason for consult: Follow-up assessment     Mom attempting to latch infant.  Baby unable to grasp breast and latch in football position.  Baby crying and fussy refusing to attempt to latch.  LC took baby and calmed baby then attempted to get baby to latch on opposite breast in cross cradle.  Infant fell asleep.  LC hand expressed into spoon approx. 5 gtts of colostrum.  Infant took that amount but was too sleepy to latch.  LC reclined bed and moved mom into a laid back position.  Mom has flat nipples with easily compressible breast tissue and a good amount of colostrum noted on hand expression.  After giving baby drops of colostrum via spoon and finger LC placed baby on moms chest and baby was able to latch and sustained with audible swallows and good rhythmic jaw movements for 5 minutes.  LC encouraged mom to massage breast during feed.  Baby self detached after 5 minutes of feeding and then fell asleep on moms chest.    LC discussed with mom importance of STS and hand expressing prior to feeding.  Also encouraged mom to try laid back nursing and explained that baby may be able to grasp nipple/breast and stay latched easier in that position.  Mom expressed understanding along with FOB.   Mom seems very dedicated to breastfeeding and patient with infant during the multitple positions and attempts before successfully latching.  Mom encouraged to call out for future questions or concerns or assistance with breastfeeding.  LC reminded mom infant needs to feed a minimum of 8/12 times in 24 hours.     Maternal Data Has patient been taught Hand Expression?: Yes Does the patient have breastfeeding experience prior to this delivery?: No  Feeding Feeding Type: Breast Fed Length of feed: 5 min  LATCH Score/Interventions Latch: Repeated attempts needed to sustain latch,  nipple held in mouth throughout feeding, stimulation needed to elicit sucking reflex. Intervention(s): Skin to skin;Teach feeding cues Intervention(s): Adjust position;Breast massage  Audible Swallowing: A few with stimulation Intervention(s): Skin to skin Intervention(s): Skin to skin  Type of Nipple: Flat Intervention(s): No intervention needed (compressable )  Comfort (Breast/Nipple): Soft / non-tender     Assistance needed to correctly latch  Latch 6  Lactation Tools Discussed/Used     Consult Status Consult Status: Follow-up Date: 01/19/17 Follow-up type: In-patient    Maryruth HancockKelly Suzanne [facility] 01/18/2017, 11:44 AM

## 2017-01-18 NOTE — Lactation Note (Signed)
This note was copied from a baby's chart. Lactation Consultation Note  Patient Name: Boy Levert FeinsteinVictoria Jurewicz ZOXWR'UToday's Date: 01/18/2017 Reason for consult: Follow-up assessment Baby at 33 hr of life. Mom requested latch help. Mom placed baby in football. Demonstrated how to bring baby to the breast and not chace baby with the nipple. Baby was not cueing but mom wanted baby to latch. Baby latched for a few minutes and 1 swallow was noted. Mom can easily express large drops of colostrum. Dad is at bedside and supportive. Parents are aware of lactation services and support group. They will call as needed.   Maternal Data    Feeding Feeding Type: Breast Fed Length of feed: 5 min  LATCH Score/Interventions Latch: Repeated attempts needed to sustain latch, nipple held in mouth throughout feeding, stimulation needed to elicit sucking reflex. Intervention(s): Teach feeding cues;Waking techniques Intervention(s): Breast compression;Assist with latch  Audible Swallowing: A few with stimulation Intervention(s): Hand expression;Skin to skin Intervention(s): Alternate breast massage  Type of Nipple: Everted at rest and after stimulation Intervention(s): No intervention needed  Comfort (Breast/Nipple): Soft / non-tender     Hold (Positioning): Assistance needed to correctly position infant at breast and maintain latch. Intervention(s): Position options;Support Pillows  LATCH Score: 7  Lactation Tools Discussed/Used     Consult Status Consult Status: Follow-up Date: 01/19/17 Follow-up type: In-patient    Rulon Eisenmengerlizabeth E Analee Montee 01/18/2017, 6:28 PM

## 2017-01-18 NOTE — Lactation Note (Signed)
This note was copied from a baby's chart. Lactation Consultation Note: Mom called out for assist with latch. Unwrapped baby, reviewed feeding cues. Baby latched well, some swallows noted, Encouraged breast compression to keep him going. No questions at present. To call prn  Patient Name: Abigail Levert FeinsteinVictoria Dial [id number]'XToday's Date: 01/18/2017 Reason for consult: Follow-up assessment   Maternal Data Has patient been taught Hand Expression?: Yes Does the patient have breastfeeding experience prior to this delivery?: No  Feeding Feeding Type: Breast Fed Length of feed: 10 min  LATCH Score/Interventions Latch: Grasps breast easily, tongue down, lips flanged, rhythmical sucking. Intervention(s): Skin to skin;Teach feeding cues Intervention(s): Adjust position;Breast massage  Audible Swallowing: A few with stimulation Intervention(s): Skin to skin Intervention(s): Skin to skin  Type of Nipple: Everted at rest and after stimulation Intervention(s): No intervention needed (compressable )  Comfort (Breast/Nipple): Soft / non-tender     Hold (Positioning): Assistance needed to correctly position infant at breast and maintain latch. Intervention(s): Breastfeeding basics reviewed  LATCH Score: 8  Lactation Tools Discussed/Used     Consult Status Consult Status: Follow-up Date: 01/19/17 Follow-up type: In-patient    Pamelia HoitWeeks, [name] D 01/18/2017, 1:27 PM

## 2017-01-18 NOTE — Discharge Summary (Signed)
OB Discharge Summary     Patient Name: Abigail Trevino DOB: [birth year] MRN: [id number]  Date of admission: 01/15/2017 Delivering Abigail Trevino: Jen MowMUMAW, [facility]   Date of discharge: 01/18/2017  Admitting diagnosis: 40w ob requested a hospital visit due to drop in heartrate Intrauterine pregnancy: 6936w2d     Secondary diagnosis:  Principal Problem:   SVD (spontaneous vaginal delivery)  Additional problems:  Prolonged deceleration noted on NST in office with BPP of 6/8     Discharge diagnosis: Term Pregnancy Delivered                                                                                                Post partum procedures:None  Augmentation: Pitocin and Foley Balloon  Complications: None  Hospital course:  Induction of Labor With Vaginal Delivery   21 y.o. yo G1P1001 at 7836w2d was admitted to the hospital 01/15/2017 for induction of labor.  Indication for induction: Non-reassuring FHT.  Patient had an uncomplicated labor course as follows: Membrane Rupture Time/Date: 5:03 PM ,01/16/2017   Intrapartum Procedures: Episiotomy: None [1]                                         Lacerations:  Vaginal [6];Labial [10]  Patient had delivery of a Viable infant.  Information for the patient's newborn:  Abigail Trevino, Abigail Trevino [[id number]][[id number]]  Delivery Method: Vaginal, Spontaneous Delivery (Filed from Delivery Summary)   01/17/2017  Details of delivery can be found in separate delivery note.  Patient had a routine postpartum course. Patient is discharged home 01/18/17.  Physical exam  Vitals:   01/17/17 1300 01/17/17 1633 01/17/17 2008 01/18/17 0555  BP: 111/80 102/63 126/81 (!) 107/50  Pulse: 84 62 96 71  Resp: 18 18 18 18   Temp: 98 F (36.7 C) 97.5 F (36.4 C) 97.7 F (36.5 C) 97.7 F (36.5 C)  TempSrc: Oral Axillary Oral Oral  SpO2: 98%     Weight:      Height:       General: alert, cooperative and no distress Lochia: appropriate Uterine Fundus: firm Incision:  N/A DVT Evaluation: No evidence of DVT seen on physical exam. Negative Homan's sign. No significant calf/ankle edema. Labs: Lab Results  Component Value Date   WBC 12.3 (H) 01/18/2017   HGB 9.3 (L) 01/18/2017   HCT 26.2 (L) 01/18/2017   MCV 82.9 01/18/2017   PLT 183 01/18/2017   No flowsheet data found.  Discharge instruction: per After Visit Summary and "Baby and Me Booklet".  After visit meds:  Allergies as of 01/18/2017   No Known Allergies     Medication List    STOP taking these medications   COMFORT FIT MATERNITY SUPP SM Misc     TAKE these medications   calcium carbonate 500 MG chewable tablet Commonly known as:  TUMS - dosed in mg elemental calcium Chew 1 tablet by mouth daily.   ibuprofen 600 MG tablet Commonly known as:  ADVIL,MOTRIN Take 1 tablet (600 mg total) by mouth  every 6 (six) hours.   PRENATAL GUMMIES/DHA & FA 0.4-32.5 MG Chew Chew 2 capsules by mouth daily.   senna-docusate 8.6-50 MG tablet Commonly known as:  Senokot-S Take 2 tablets by mouth daily. Start taking on:  01/19/2017       Diet: routine diet  Activity: Advance as tolerated. Pelvic rest for 6 weeks.   Outpatient follow up:6 weeks Follow up Appt:No future appointments. Follow up Visit:No Follow-up on file.  Postpartum contraception: IUD Undecided  Newborn Data: Live born female  Birth Weight: 9 lb 1.7 oz (4130 g) APGAR: 7, 9  Baby Feeding: Breast Disposition:home with mother   Delivery was complicated by a 1 minute shoulder dystocia that required 4 manuevers, but was ultimately reduced and newborn did well.    01/18/2017 Abigail Harms, Abigail Trevino   CNM attestation I have seen and examined this patient and agree with above documentation in the resident's note.   Abigail Trevino is a 21 y.o. G1P1001 s/p SVD.   Pain is well controlled.  Plan for birth control is IUD.  Method of Feeding: breast  PE:  BP (!) 107/50 (BP Location: Right Arm)   Pulse 71   Temp 97.7 F (36.5  C) (Oral)   Resp 18   Ht 5\' 4"  (1.626 m)   Wt 101.2 kg (223 lb)   SpO2 98%   Breastfeeding? Unknown   BMI 38.28 kg/m  Fundus firm  No results for input(s): HGB, HCT in the last 72 hours.   Plan: discharge today - postpartum care discussed - f/u clinic in 6 weeks for postpartum visit   Abigail Trevino, CNM 12:28 AM

## 2017-01-19 ENCOUNTER — Ambulatory Visit: Payer: Self-pay

## 2017-01-19 NOTE — Lactation Note (Signed)
This note was copied from a baby's chart. Lactation Consultation Note: Mom concerned that baby has been fussy through the night, Will only latch for a few minutes then gets fussy. RN started NS. Attempted to latch baby to bare breast- he gets fussy- on and off the breast. Used NS. Baby latched well and nursed for 15 min on left breast in football hold. Mom reports this is the best he has done all night. Latched to right breast and nursed for 10 min with NS then going off to sleep. Colostrum noted in NS when he came off the breast. Encouragement given. Going to see [facility] and encouraged to see LC there. Can call us prn. No questions at present.   Patient Name: Abigail Levert FeinsteinVictoria Montejano AVWUJ'WToday's Date: 01/19/2017 Reason for consult: Follow-up assessment   Maternal Data Formula Feeding for Exclusion: No Has patient been taught Hand Expression?: Yes Does the patient have breastfeeding experience prior to this delivery?: No  Feeding Feeding Type: Breast Fed Length of feed: 25 min  LATCH Score/Interventions Latch: Grasps breast easily, tongue down, lips flanged, rhythmical sucking.  Audible Swallowing: A few with stimulation  Type of Nipple: Everted at rest and after stimulation (short nipples)  Comfort (Breast/Nipple): Soft / non-tender     Hold (Positioning): Assistance needed to correctly position infant at breast and maintain latch. Intervention(s): Breastfeeding basics reviewed  LATCH Score: 8  Lactation Tools Discussed/Used Tools: Nipple Shields Nipple shield size: 20   Consult Status Consult Status: Complete    Pamelia HoitWeeks, Marisela Line D 01/19/2017, 9:06 AM

## 2017-03-05 ENCOUNTER — Ambulatory Visit: Payer: Medicaid Other | Admitting: Obstetrics & Gynecology

## 2017-03-05 ENCOUNTER — Ambulatory Visit (INDEPENDENT_AMBULATORY_CARE_PROVIDER_SITE_OTHER): Payer: Medicaid Other | Admitting: Obstetrics & Gynecology

## 2017-03-05 ENCOUNTER — Encounter: Payer: Self-pay | Admitting: Obstetrics & Gynecology

## 2017-03-05 VITALS — BP 110/67 | HR 73 | Resp 16 | Ht 64.0 in | Wt 205.0 lb

## 2017-03-05 DIAGNOSIS — Z30430 Encounter for insertion of intrauterine contraceptive device: Secondary | ICD-10-CM | POA: Diagnosis not present

## 2017-03-05 DIAGNOSIS — Z3202 Encounter for pregnancy test, result negative: Secondary | ICD-10-CM | POA: Diagnosis not present

## 2017-03-05 DIAGNOSIS — Z8759 Personal history of other complications of pregnancy, childbirth and the puerperium: Secondary | ICD-10-CM

## 2017-03-05 LAB — POCT URINE PREGNANCY: Preg Test, Ur: NEGATIVE

## 2017-03-05 MED ORDER — LEVONORGESTREL 20 MCG/24HR IU IUD
INTRAUTERINE_SYSTEM | Freq: Once | INTRAUTERINE | Status: AC
  Administered 2017-03-05: 14:00:00 via INTRAUTERINE

## 2017-03-05 MED ORDER — LEVONORGESTREL 18.6 MCG/DAY IU IUD: INTRAUTERINE_SYSTEM | Freq: Once | INTRAUTERINE | Status: DC

## 2017-03-05 NOTE — Progress Notes (Signed)
Post Partum Exam  Abigail Trevino is a 21 y.o. 311P1001 female who presents for a postpartum visit. She is 6 week postpartum following a spontaneous vaginal delivery. I have fully reviewed the prenatal and intrapartum course. The delivery was at 4458w2d gestational weeks.  Anesthesia: epidural. Postpartum course has been unremarkable. Baby's course has been unremarkable. Baby is feeding by breast. Bleeding no bleeding. Bowel function is normal. Bladder function is normal. Patient is not sexually active. Contraception method is IUD. Postpartum depression screening:neg  The following portions of the patient's history were reviewed and updated as appropriate: allergies, current medications, past family history, past medical history, past social history, past surgical history and problem list.  Review of Systems Pertinent items noted in HPI and remainder of comprehensive ROS otherwise negative.    Objective:  Blood pressure 110/67, pulse 73, resp. rate 16, height 5\' 4"  (1.626 m), weight 205 lb (93 kg), unknown if currently breastfeeding.  General:  alert, cooperative and no distress   Breasts:  negative  Lungs: clear to auscultation bilaterally  Heart:  regular rate and rhythm  Abdomen: soft, non-tender; bowel sounds normal; no masses,  no organomegaly   Vulva:  normal  Vagina: normal vagina  Cervix:  no cervical motion tenderness  Corpus: normal size, contour, position, consistency, mobility, non-tender  Adnexa:  normal adnexa  Rectal Exam: Not performed.        Assessment:    nml postpartum exam.   Plan:   1. Contraception: IUD . Follow up in: 6 weeks or as needed.   IUD Procedure Note Patient identified, informed consent performed.  Discussed risks of irregular bleeding, cramping, infection, malpositioning or misplacement of the IUD outside the uterus which may require further procedures. Time out was performed.  Urine pregnancy test negative.  Speculum placed in the vagina.  Cervix  visualized.  Cleaned with Betadine x 2.  Grasped anteriorly with a single tooth tenaculum.  Uterus sounded to 7 cm.  Mirena IUD placed per manufacturer's recommendations.  Strings trimmed to 3 cm. Tenaculum was removed, good hemostasis noted.  Patient tolerated procedure well.   Patient was given post-procedure instructions and the Mirena care card with expiration date.  Patient was also asked to check IUD strings periodically and follow up in 4-6 weeks for IUD check.

## 2017-03-05 NOTE — Progress Notes (Signed)
error 

## 2017-03-08 ENCOUNTER — Encounter: Payer: Self-pay | Admitting: Obstetrics & Gynecology

## 2017-03-08 NOTE — Progress Notes (Signed)
Erroneous encounter

## 2017-04-16 ENCOUNTER — Encounter: Payer: Self-pay | Admitting: Obstetrics & Gynecology

## 2017-04-16 ENCOUNTER — Encounter (INDEPENDENT_AMBULATORY_CARE_PROVIDER_SITE_OTHER): Payer: Self-pay

## 2017-04-16 ENCOUNTER — Ambulatory Visit (INDEPENDENT_AMBULATORY_CARE_PROVIDER_SITE_OTHER): Payer: Medicaid Other | Admitting: Obstetrics & Gynecology

## 2017-04-16 VITALS — BP 111/71 | HR 63 | Ht 66.0 in | Wt 210.0 lb

## 2017-04-16 DIAGNOSIS — Z30431 Encounter for routine checking of intrauterine contraceptive device: Secondary | ICD-10-CM | POA: Diagnosis not present

## 2017-04-16 DIAGNOSIS — E669 Obesity, unspecified: Secondary | ICD-10-CM | POA: Insufficient documentation

## 2017-04-16 DIAGNOSIS — E6609 Other obesity due to excess calories: Secondary | ICD-10-CM

## 2017-04-16 NOTE — Progress Notes (Signed)
   Subjective:    Patient ID: Abigail Trevino, female    DOB: [birth year], 21 y.o.   MRN: [id number]  HPI  Pt here for IUD string check.  Spotted 2-3 weeks after insertion.  Started period today   No pain.    Review of Systems  Constitutional: Negative.   Gastrointestinal: Negative.   Genitourinary: Negative.   Psychiatric/Behavioral: Negative.        Objective:   Physical Exam  Constitutional: She is oriented to person, place, and time. She appears well-developed and well-nourished. No distress.  HENT:  Head: Normocephalic and atraumatic.  Eyes: Conjunctivae are normal.  Pulmonary/Chest: Effort normal.  Abdominal: Soft. She exhibits no distension and no mass. There is no tenderness. There is no rebound and no guarding.  Genitourinary: Vagina normal and uterus normal.  Genitourinary Comments: Strings visualized and approx 3 cm, lapping over cervix to patient's right.  Musculoskeletal: She exhibits no edema.  Neurological: She is alert and oriented to person, place, and time.  Skin: Skin is warm and dry.  Psychiatric: She has a normal mood and affect.  Vitals reviewed.   Vitals:   04/16/17 1348  BP: 111/71  Pulse: 63  Weight: 210 lb (95.3 kg)  Height:  (1.676 m)         Assessment & Plan:  Normal exam with IUD in situ Strings 3 cm RTC 1 year Pap q 3 years, pt reports last pap 1 year ago and nml.  Will see if we can get a copy.

## 2017-05-01 ENCOUNTER — Encounter: Payer: Self-pay | Admitting: Obstetrics & Gynecology

## 2017-05-01 DIAGNOSIS — R87616 Satisfactory cervical smear but lacking transformation zone: Secondary | ICD-10-CM | POA: Insufficient documentation

## 2017-05-15 ENCOUNTER — Encounter: Payer: Self-pay | Admitting: Family Medicine

## 2017-05-15 ENCOUNTER — Ambulatory Visit (HOSPITAL_BASED_OUTPATIENT_CLINIC_OR_DEPARTMENT_OTHER)
Admission: RE | Admit: 2017-05-15 | Discharge: 2017-05-15 | Disposition: A | Discharge status: Home / Self Care | Payer: Medicaid Other | Source: Ambulatory Visit | Attending: Family Medicine | Admitting: Family Medicine

## 2017-05-15 ENCOUNTER — Ambulatory Visit (INDEPENDENT_AMBULATORY_CARE_PROVIDER_SITE_OTHER): Payer: Medicaid Other | Admitting: Family Medicine

## 2017-05-15 ENCOUNTER — Other Ambulatory Visit: Payer: Self-pay | Admitting: Family Medicine

## 2017-05-15 DIAGNOSIS — R1011 Right upper quadrant pain: Secondary | ICD-10-CM | POA: Diagnosis present

## 2017-05-15 DIAGNOSIS — R109 Unspecified abdominal pain: Secondary | ICD-10-CM | POA: Insufficient documentation

## 2017-05-15 LAB — COMPLETE METABOLIC PANEL WITH GFR
ALT: 15 U/L (ref 6–29)
AST: 17 U/L (ref 10–30)
Albumin: 4.1 g/dL (ref 3.6–5.1)
Alkaline Phosphatase: 74 U/L (ref 33–115)
BUN: 10 mg/dL (ref 7–25)
CALCIUM: 8.9 mg/dL (ref 8.6–10.2)
CHLORIDE: 105 mmol/L (ref 98–110)
CO2: 21 mmol/L (ref 20–31)
Creat: 0.63 mg/dL (ref 0.50–1.10)
GFR, Est Non African American: >89 mL/min (ref >=60)
Glucose, Bld: 83 mg/dL (ref 65–99)
POTASSIUM: 4.2 mmol/L (ref 3.5–5.3)
Sodium: 139 mmol/L (ref 135–146)
Total Bilirubin: 0.3 mg/dL (ref 0.2–1.2)
Total Protein: 7 g/dL (ref 6.1–8.1)

## 2017-05-15 LAB — CBC
HEMATOCRIT: 41.9 % (ref 35.0–45.0)
Hemoglobin: 14.1 g/dL (ref 11.7–15.5)
MCH: 28 pg (ref 27.0–33.0)
MCHC: 33.7 g/dL (ref 32.0–36.0)
MCV: 83.1 fL (ref 80.0–100.0)
MPV: 10.2 fL (ref 7.5–12.5)
Platelets: 280 10*3/uL (ref 140–400)
RBC: 5.04 MIL/uL (ref 3.80–5.10)
RDW: 14.3 % (ref 11.0–15.0)
WBC: 4.6 10*3/uL (ref 3.8–10.8)

## 2017-05-15 LAB — LIPASE: LIPASE: 14 U/L (ref 7–60)

## 2017-05-15 NOTE — Progress Notes (Signed)
       Levert FeinsteinVictoria Carboni is a 21 y.o. female who [facility] today for abdominal pain.  Patient has a 4 day history of upper right quadrant abdominal pain. This does not seem to be related to food. She denies any fevers or chills vomiting or diarrhea. She has not tried any medications yet. She had a similar episode a few years ago. She has a 7669-month-old baby and is currently bottlefeeding. She uses Mirena IUD for birth control.   Past Medical History:  Diagnosis Date  . Medical history non-contributory    Past Surgical History:  Procedure Laterality Date  . NO PAST SURGERIES     Social History  Substance Use Topics  . Smoking status: Former Smoker    Packs/day: 0.50    Years: 2.00    Types: Cigarettes  . Smokeless tobacco: Never Used  . Alcohol use No   family history includes Fibromyalgia in her maternal grandmother; Heart disease in her maternal grandfather; Lymphoma in her maternal grandfather.  ROS as above:  Medications: Current Outpatient Prescriptions  Medication Sig Dispense Refill  . ibuprofen (ADVIL,MOTRIN) 600 MG tablet Take 1 tablet (600 mg total) by mouth every 6 (six) hours. 30 tablet 0  . Prenatal MV-Min-FA-Omega-3 (PRENATAL GUMMIES/DHA & FA) 0.4-32.5 MG CHEW Chew 2 capsules by mouth daily.     No current facility-administered medications for this visit.    No Known Allergies  Health Maintenance Health Maintenance  Topic Date Due  . TETANUS/TDAP  04/12/2013  . PAP SMEAR  04/13/2015  . INFLUENZA VACCINE  07/25/2017  . CHLAMYDIA SCREENING  09/29/2017  . HIV Screening  Completed     Exam:  BP 111/68   Pulse 73   Wt 209 lb (94.8 kg)   BMI 33.73 kg/m  Gen: Well NAD HEENT: EOMI,  MMM Lungs: Normal work of breathing. CTABL Heart: RRR no MRG Abd: NABS, Soft. Nondistended, Mildly tender to palpation upper right quadrant with  positive Murphy sign. No CVA angle tenderness to percussion. No rebound or severe guarding. Exts: Brisk capillary refill, warm and well perfused.    No results found for this or any previous visit (from the past 72 hour(s)). No results found.    Assessment and Plan: 21 y.o. female with abdominal pain. Concerning for gallbladder disease or pancreatitis potentially for gastritis. Plan for H. pylori testing CBC CMP lipase and abdominal ultrasound. Follow-up after testing.   Orders Placed This Encounter  Procedures  . US Abdomen Complete    Standing Status:   Future    Standing Expiration Date:   07/15/2018    Order Specific Question:   Reason for Exam (SYMPTOM  OR DIAGNOSIS REQUIRED)    Answer:   eval rt abdominal pain    Order Specific Question:   Preferred imaging location?    [facility]  . CBC  . COMPLETE METABOLIC PANEL WITH GFR  . Lipase  . H. pylori breath test   No orders of the defined types were placed in this encounter.    Discussed warning signs or symptoms. Please see discharge instructions. Patient expresses understanding.

## 2017-05-15 NOTE — Patient Instructions (Addendum)
Thank you for coming in today. Get labs and ultrasound soon.  We will contact with results.  Avoid fatty foods.  Consider a clear liquid diet or a bland diet.    Abdominal Pain, Adult Abdominal pain can be caused by many things. Often, abdominal pain is not serious and it gets better with no treatment or by being treated at home. However, sometimes abdominal pain is serious. Your health care provider will do a medical history and a physical exam to try to determine the cause of your abdominal pain. Follow these instructions at home:  Take over-the-counter and prescription medicines only as told by your health care provider. Do not take a laxative unless told by your health care provider.  Drink enough fluid to keep your urine clear or pale yellow.  Watch your condition for any changes.  Keep all follow-up visits as told by your health care provider. This is important. Contact a health care provider if:  Your abdominal pain changes or gets worse.  You are not hungry or you lose weight without trying.  You are constipated or have diarrhea for more than 2-3 days.  You have pain when you urinate or have a bowel movement.  Your abdominal pain wakes you up at night.  Your pain gets worse with meals, after eating, or with certain foods.  You are throwing up and cannot keep anything down.  You have a fever. Get help right away if:  Your pain does not go away as soon as your health care provider told you to expect.  You cannot stop throwing up.  Your pain is only in areas of the abdomen, such as the right side or the left lower portion of the abdomen.  You have bloody or black stools, or stools that look like tar.  You have severe pain, cramping, or bloating in your abdomen.  You have signs of dehydration, such as:  Dark urine, very little urine, or no urine.  Cracked lips.  Dry mouth.  Sunken eyes.  Sleepiness.  Weakness. This information is not intended to replace  advice given to you by your health care provider. Make sure you discuss any questions you have with your health care provider. Document Released: 09/20/2005 Document Revised: 06/30/2016 Document Reviewed: 05/24/2016 Elsevier Interactive Patient Education  2017 [employer]

## 2017-05-16 LAB — H. PYLORI BREATH TEST: H. pylori Breath Test: NOT DETECTED

## 2017-05-25 NOTE — Addendum Note (Signed)
Addendum  created 05/25/17 0929 by Mckinzey Entwistle D, MD   Sign clinical note    

## 2017-09-27 IMAGING — US US ABDOMEN COMPLETE
1 series · 14 of 25 positions shown · non-contrast
Comparison: Ultrasound 03/30/2015.

CLINICAL DATA: Right upper quadrant pain.

EXAM:
ABDOMEN ULTRASOUND COMPLETE

[Series 1: us abdomen complete · 0.29mm/px · 14 of 75 slices shown]
[im 1/75]
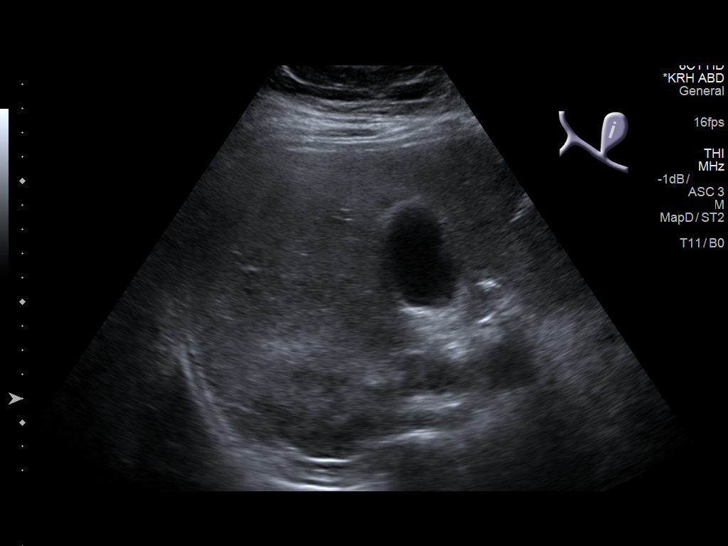
[im 7/75]
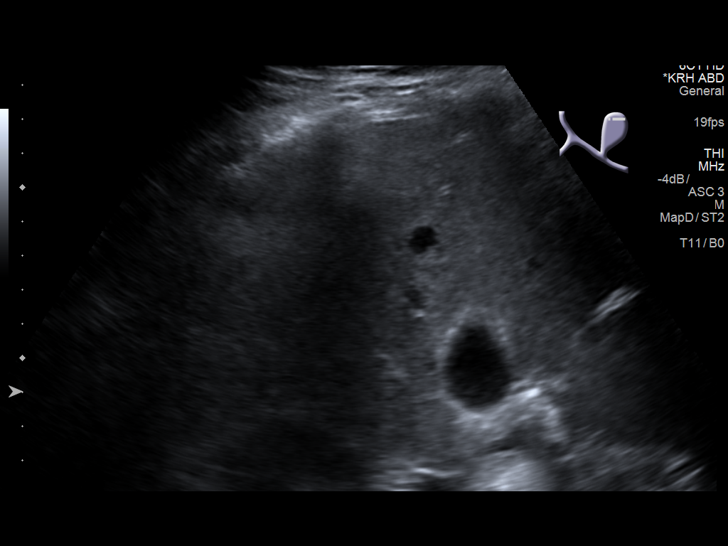
[im 13/75]
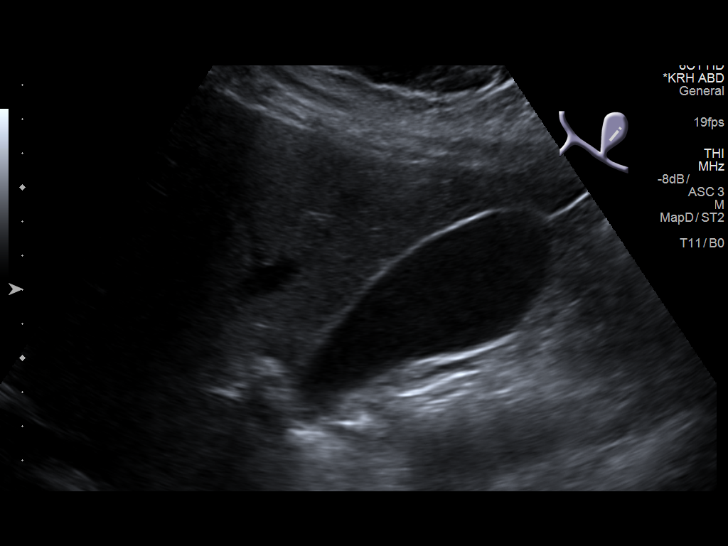
[im 19/75]
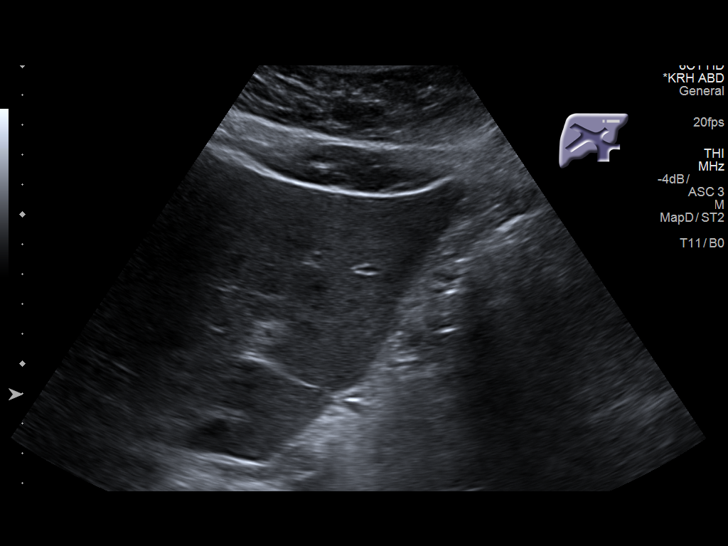
[im 25/75]
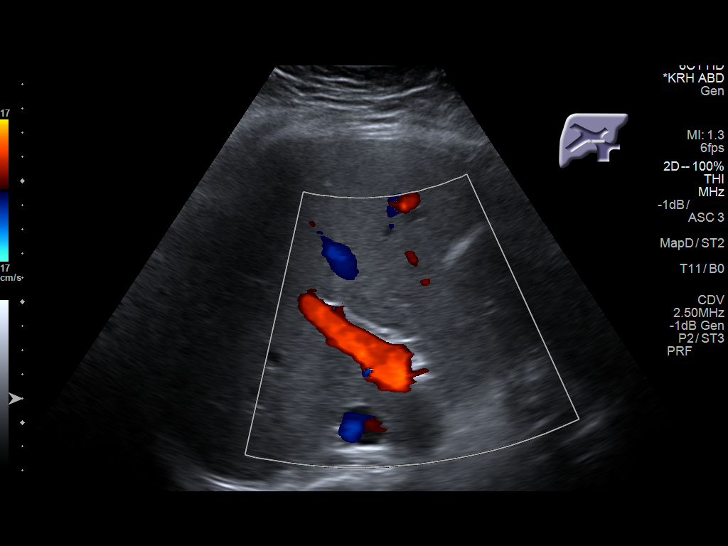
[im 28/75]
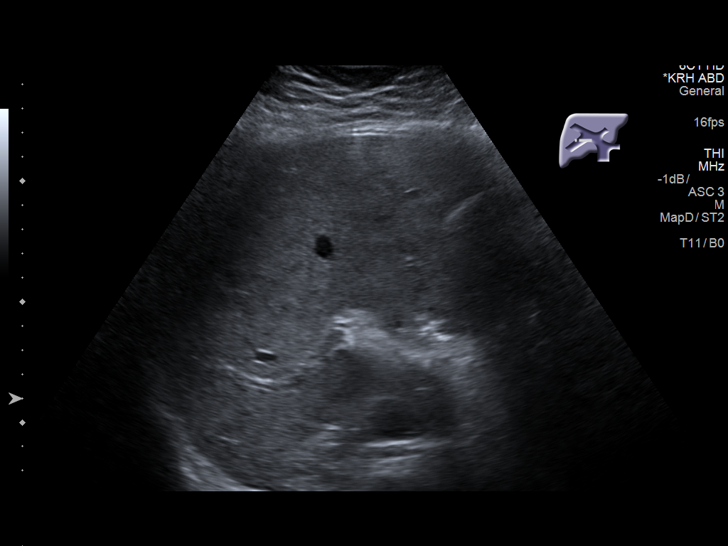
[im 34/75]
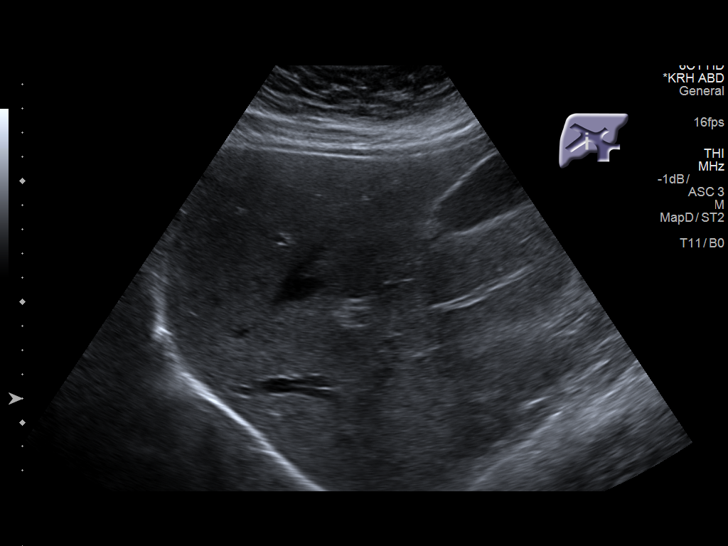
[im 41/75]
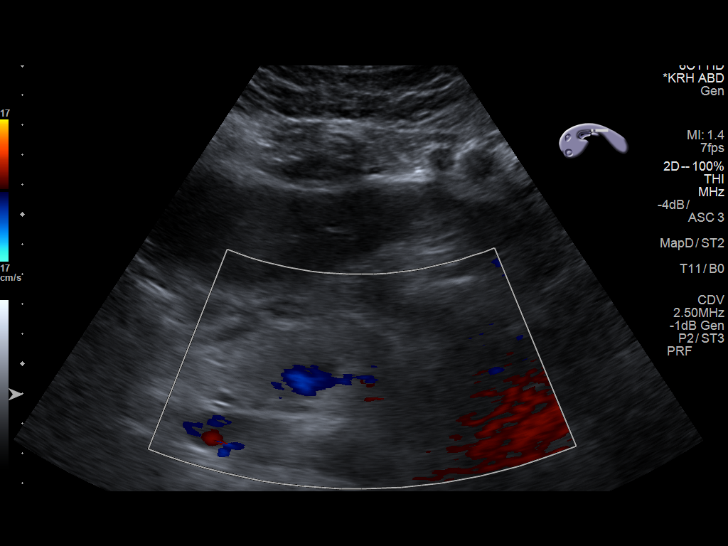
[im 47/75]
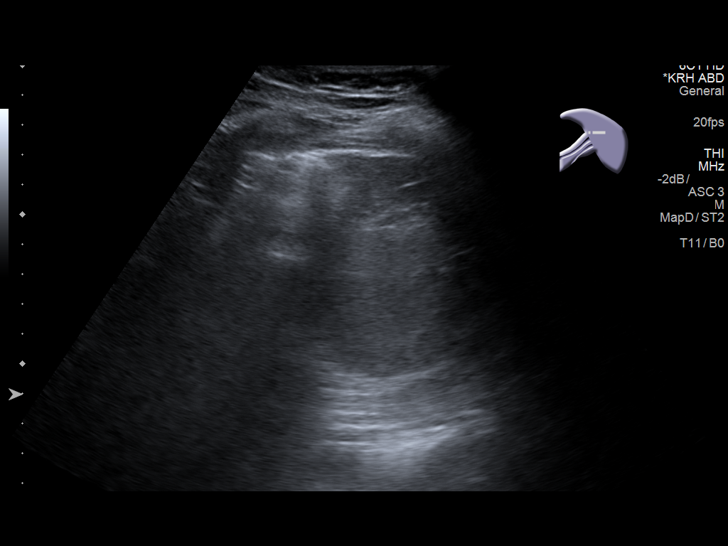
[im 50/75]
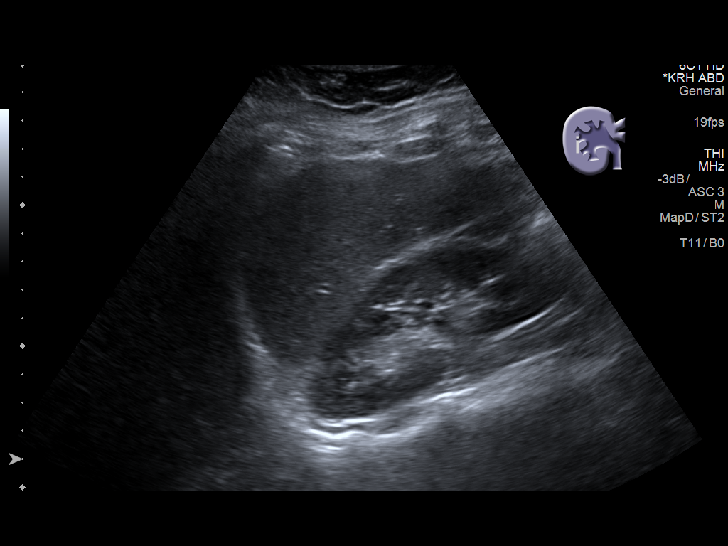
[im 56/75]
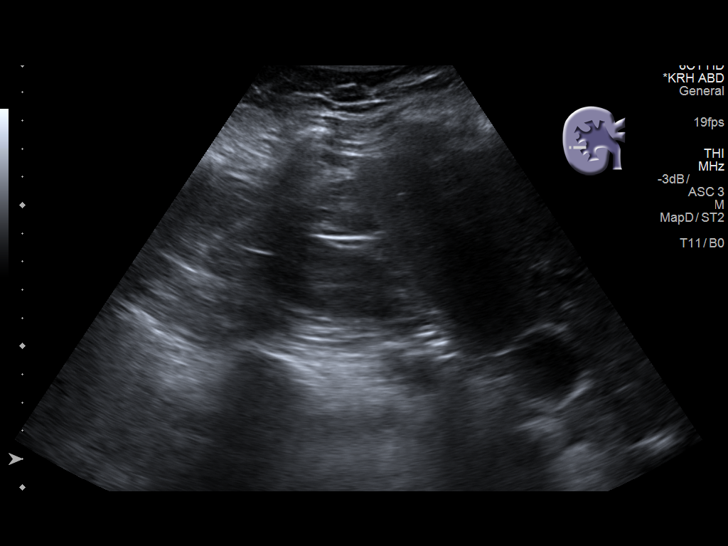
[im 62/75]
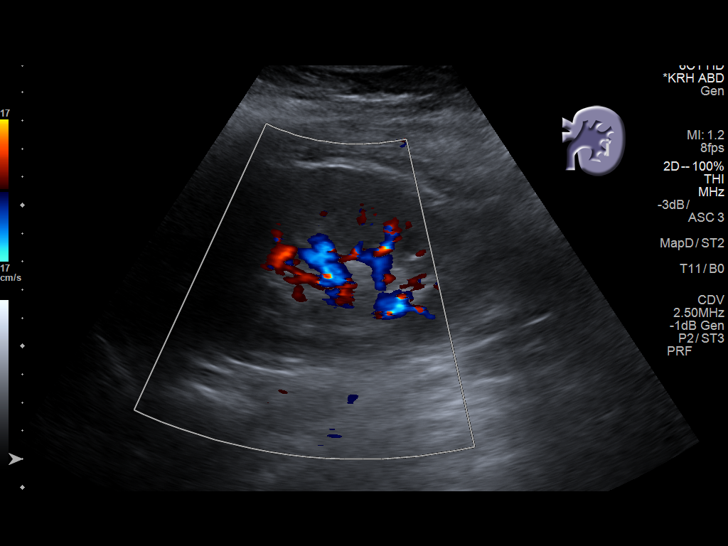
[im 68/75]
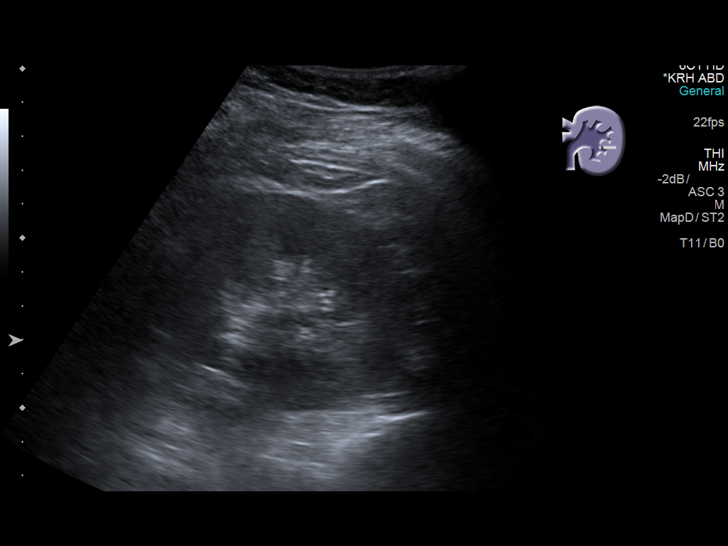
[im 75/75]
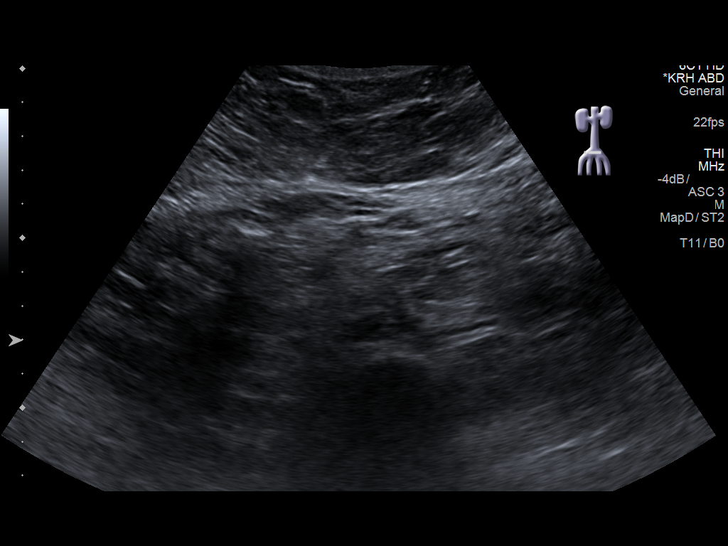

[14 of 25 positions shown; findings below may reference images not displayed]

FINDINGS: Gallbladder: Sludge in the gallbladder. No gallstones. Gallbladder
wall thickness normal.

Common bile duct: Diameter: 1.6 mm

Liver: No focal lesion identified. Within normal limits in
parenchymal echogenicity.

IVC: No abnormality visualized.

Pancreas: Visualized portion unremarkable.

Spleen: Size and appearance within normal limits. Possible small
accessory spleen.

Right Kidney: Length: 10.2 cm. Echogenicity within normal limits. No
mass or hydronephrosis visualized.

Left Kidney: Length: 11.6 cm. Echogenicity within normal limits. No
hydronephrosis visualized. 1.3 cm simple cyst.

Abdominal aorta: No aneurysm visualized.

Other findings: None.
IMPRESSION: Sludge in the gallbladder. No gallstones. No evidence cholecystitis
or biliary distention.

## 2017-11-25 IMAGING — US US MFM FETAL BPP W/O NON-STRESS
1 series · 12 of 28 positions shown · non-contrast
Comparison: none

[Series 1: us mfm fetal bpp w/o non-stress · 49 acquisitions, 12 frames shown]
[im 2/49]
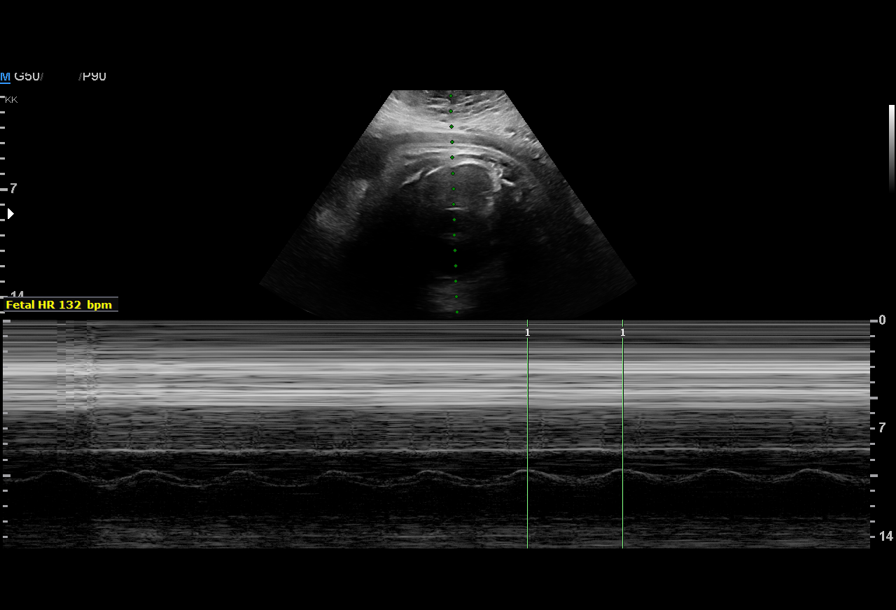
[im 6/49]
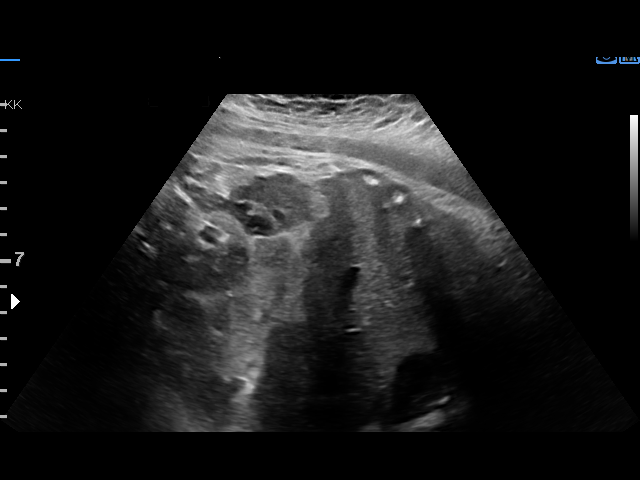
[im 9/49]
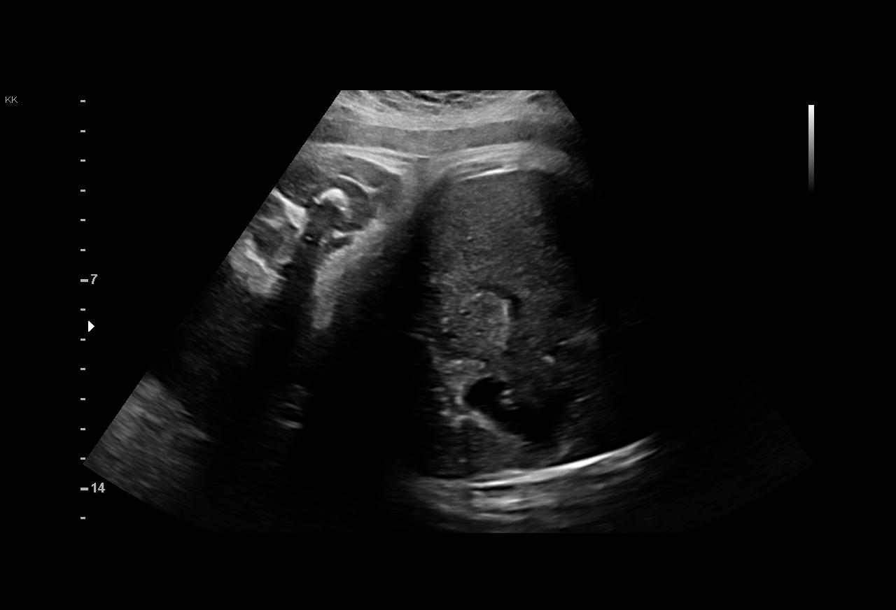
[im 15/49]
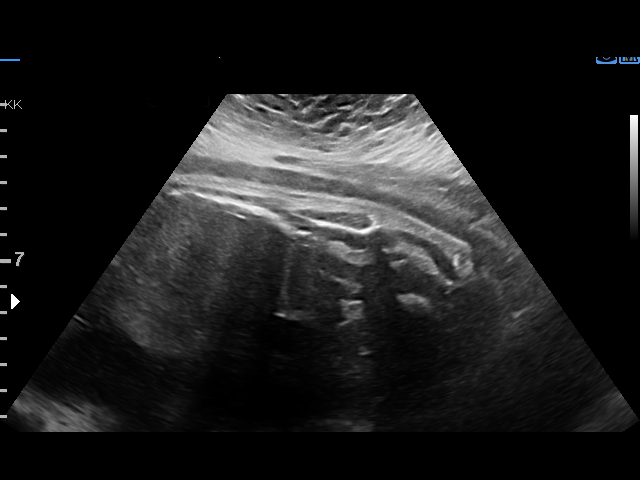
[im 18/49]
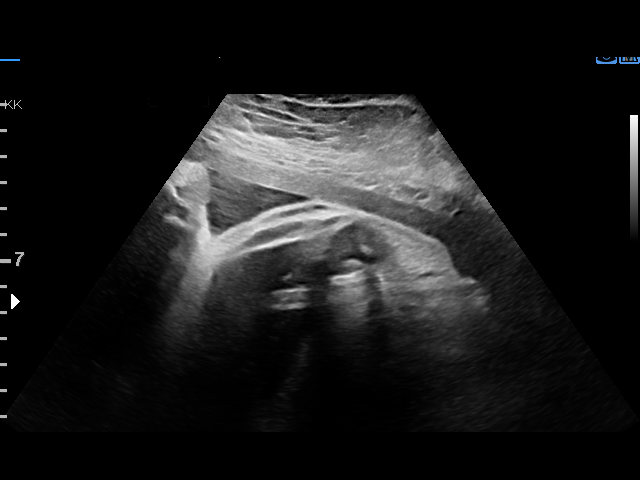
[im 22/49]
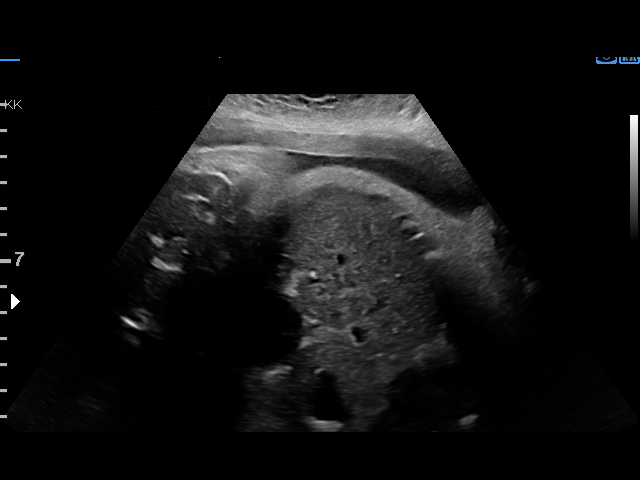
[im 27/49]
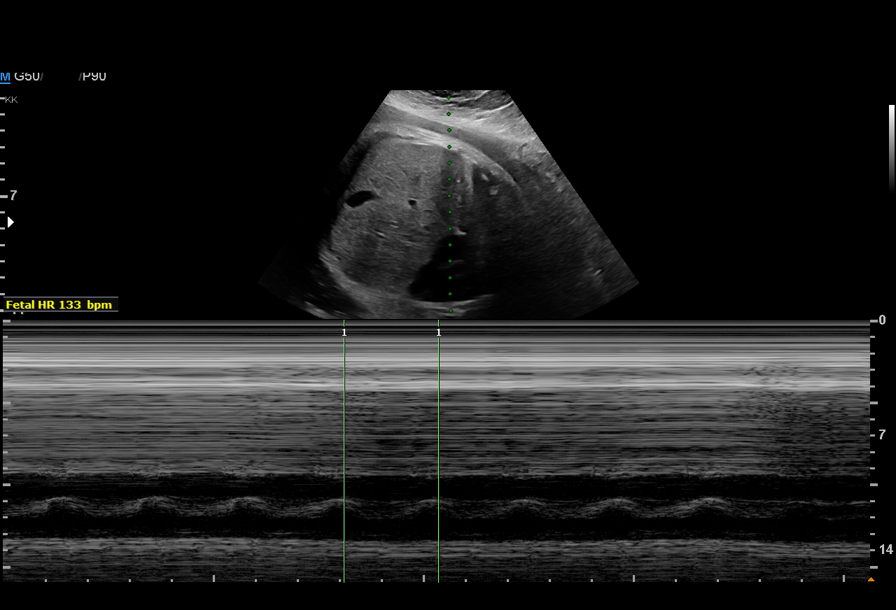
[im 31/49]
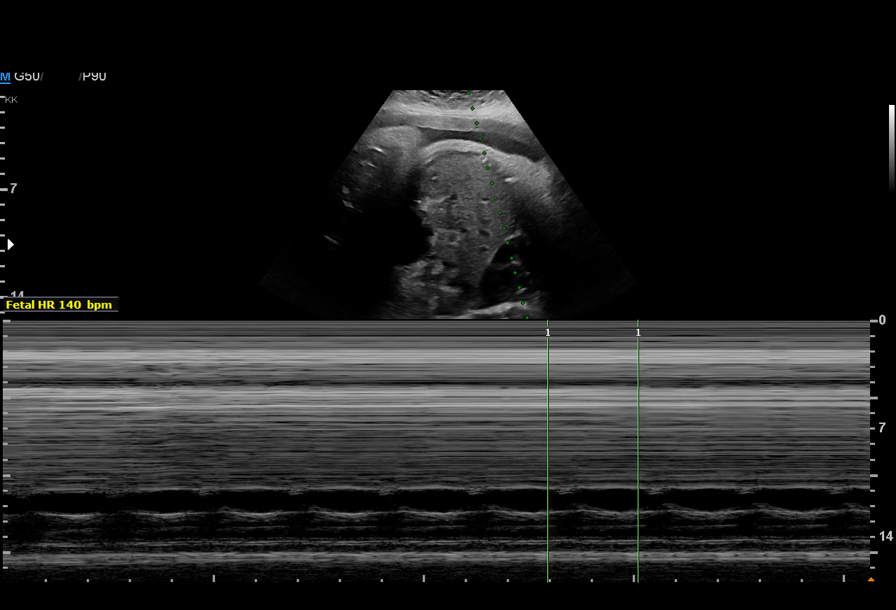
[im 34/49]
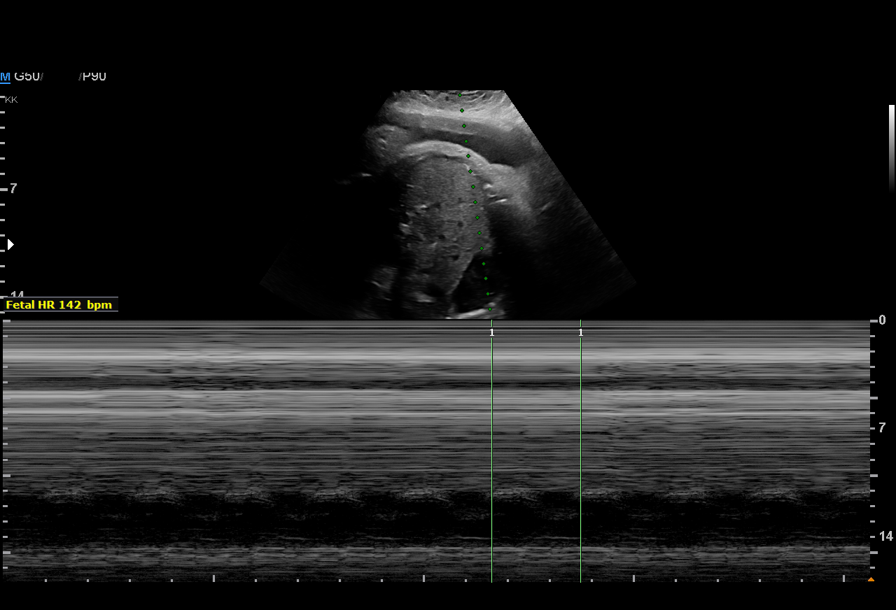
[im 40/49]
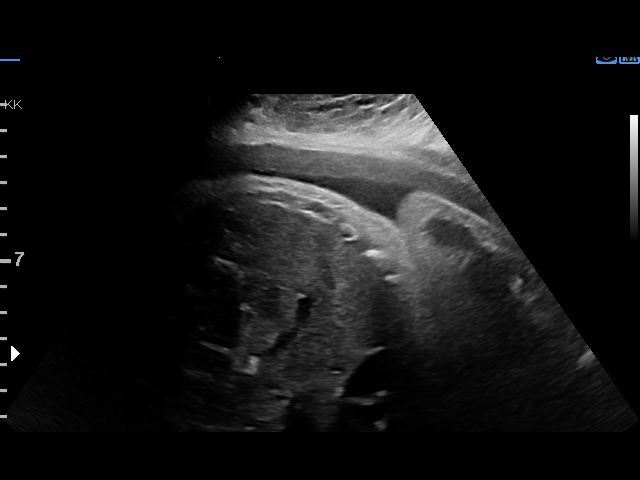
[im 43/49]
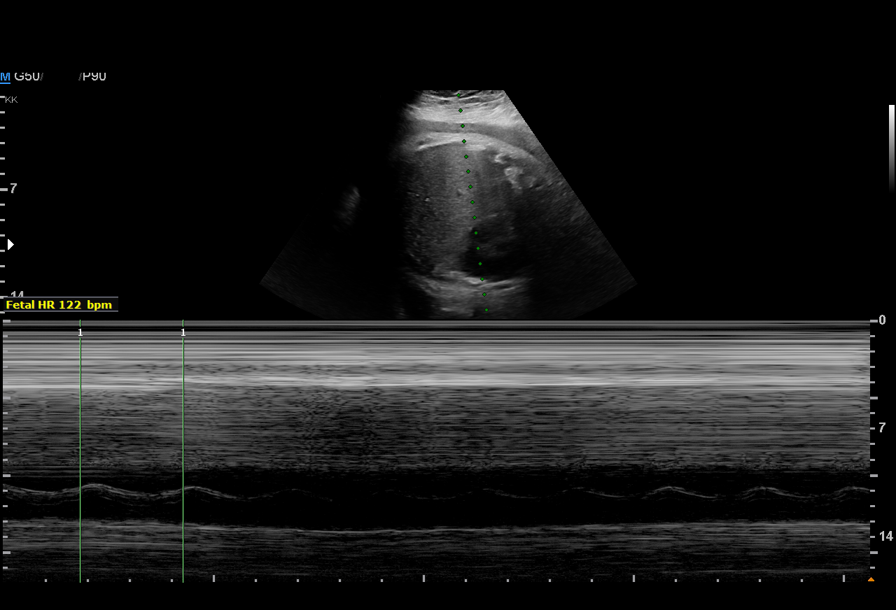
[im 47/49]
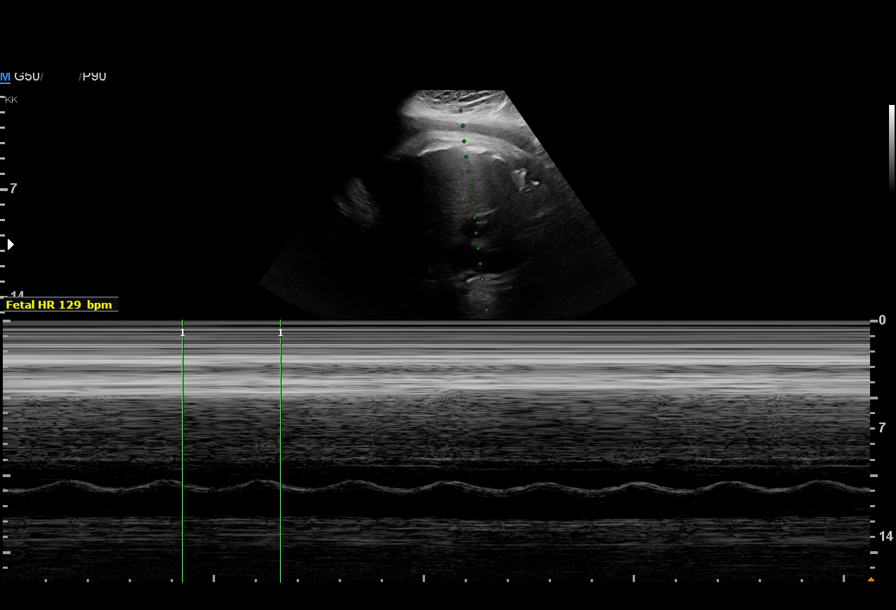

[12 of 28 positions shown; findings below may reference images not displayed]

Indications

40 weeks gestation of pregnancy
Non-reactive NST, FHR decelerations
OB History

Blood Type:            Height:  5'4"   Weight (lb):  223       BMI:
Gravidity:    1
Fetal Evaluation

Num Of Fetuses:     1
Fetal Heart         129
Rate(bpm):
Cardiac Activity:   Observed
Presentation:       Cephalic

Amniotic Fluid
AFI FV:      Subjectively within normal limits

AFI Sum(cm)     %Tile       Largest Pocket(cm)
8.79            20

RUQ(cm)                     LUQ(cm)
7.01
Biophysical Evaluation

Amniotic F.V:   Pocket => 2 cm two         F. Tone:         Observed
planes
F. Movement:    Observed                   Score:           [DATE]
F. Breathing:   Not Observed
Gestational Age

Clinical EDD:  40w 0d                                        EDD:    01/15/17
Best:          40w 0d     Det. By:  Clinical EDD             EDD:    01/15/17
Impression

SIUP at 40+0 weeks
Cephalic presentation
Low normal amniotic fluid volume
BPP [DATE] (-2 for no BM)
Recommendations

Follow-up as clinically indicated

## 2018-02-28 ENCOUNTER — Other Ambulatory Visit (HOSPITAL_COMMUNITY)
Admission: RE | Admit: 2018-02-28 | Discharge: 2018-02-28 | Disposition: A | Discharge status: Home / Self Care | Payer: Medicaid Other | Source: Ambulatory Visit | Attending: Obstetrics & Gynecology | Admitting: Obstetrics & Gynecology

## 2018-02-28 ENCOUNTER — Encounter: Payer: Self-pay | Admitting: Obstetrics & Gynecology

## 2018-02-28 ENCOUNTER — Ambulatory Visit (INDEPENDENT_AMBULATORY_CARE_PROVIDER_SITE_OTHER): Payer: Medicaid Other | Admitting: Obstetrics & Gynecology

## 2018-02-28 VITALS — BP 117/77 | HR 91 | Ht 64.0 in | Wt 223.0 lb

## 2018-02-28 DIAGNOSIS — Z01419 Encounter for gynecological examination (general) (routine) without abnormal findings: Secondary | ICD-10-CM | POA: Insufficient documentation

## 2018-02-28 DIAGNOSIS — Z309 Encounter for contraceptive management, unspecified: Secondary | ICD-10-CM | POA: Diagnosis not present

## 2018-02-28 MED ORDER — NORGESTREL-ETHINYL ESTRADIOL 0.3-30 MG-MCG PO TABS: 1.0000 | ORAL_TABLET | Freq: Every day | ORAL | 11 refills | Status: AC

## 2018-02-28 MED ORDER — MISOPROSTOL 200 MCG PO TABS: ORAL_TABLET | ORAL | 0 refills | Status: AC

## 2018-02-28 NOTE — Progress Notes (Signed)
Subjective:    Abigail Trevino is a 22 y.o. single P66 (58 month old son) female who presents for an annual exam. She does not like the IUD, has painful sex since the IUD [place] was inserted.  The patient is sexually active. GYN screening history: last pap: was normal. The patient wears seatbelts: yes. The patient participates in regular exercise: yes. Has the patient ever been transfused or tattooed?: yes. The patient reports that there is not domestic violence in her life.   Menstrual History: OB History    Gravida Para Term Preterm AB Living   1 1 1     1    SAB TAB Ectopic Multiple Live Births         0 1      Menarche age: 32 No LMP recorded. Patient is not currently having periods (Reason: IUD).    The following portions of the patient's history were reviewed and updated as appropriate: allergies, current medications, past family history, past medical history, past social history, past surgical history and problem list.  Review of Systems Pertinent items are noted in HPI.    Objective:    BP 117/77   Pulse 91   Ht 5\' 4"  (1.626 m)   Wt 223 lb (101.2 kg)   Breastfeeding? No   BMI 38.28 kg/m   General Appearance:    Alert, cooperative, no distress, appears stated age  Head:    Normocephalic, without obvious abnormality, atraumatic  Eyes:    PERRL, conjunctiva/corneas clear, EOM's intact, fundi    benign, both eyes  Ears:    Normal TM's and external ear canals, both ears  Nose:   Nares normal, septum midline, mucosa normal, no drainage    or sinus tenderness  Throat:   Lips, mucosa, and tongue normal; teeth and gums normal  Neck:   Supple, symmetrical, trachea midline, no adenopathy;    thyroid:  no enlargement/tenderness/nodules; no carotid   bruit or JVD  Back:     Symmetric, no curvature, ROM normal, no CVA tenderness  Lungs:     Clear to auscultation bilaterally, respirations unlabored  Chest Wall:    No tenderness or deformity   Heart:    Regular rate and rhythm,  S1 and S2 normal, no murmur, rub   or gallop  Breast Exam:    No tenderness, masses, or nipple abnormality  Abdomen:     Soft, non-tender, bowel sounds active all four quadrants,    no masses, no organomegaly  Genitalia:    Normal female without lesion, discharge or tenderness, normal size and shape, anteverted, mobile, non-tender, normal adnexal exam  I attempted to remove her IUD with an IUD hook since the strings are not visible. After several unsuccessful attempts, I moved her to the ultrasound room and preformed a TVS. The IUD was noted at the fundus.     Extremities:   Extremities normal, atraumatic, no cyanosis or edema  Pulses:   2+ and symmetric all extremities  Skin:   Skin color, texture, turgor normal, no rashes or lesions  Lymph nodes:   Cervical, supraclavicular, and axillary nodes normal  Neurologic:   CNII-XII intact, normal strength, sensation and reflexes    throughout  .    Assessment:    Healthy female exam.   Contraception   Plan:     Thin prep Pap smear. with CT/GC Lo ovral prescribed Come back 4 weeks for weight and BP check and followup She will get the IUD removed at the [place] office with Dr. Rexene EdisonH- S and possibly the hysteroscope there.

## 2018-02-28 NOTE — Progress Notes (Signed)
Pt states intercourse has been painful since IUD. Wants IUD out and discuss other BC options.

## 2018-02-28 NOTE — Addendum Note (Signed)
Addended by: Allie BossierVE, Meghanne Pletz C on: 02/28/2018 03:38 PM   Modules accepted: Orders

## 2018-03-02 LAB — CYTOLOGY - PAP
Chlamydia: NEGATIVE
Diagnosis: NEGATIVE
NEISSERIA GONORRHEA: NEGATIVE

## 2018-03-04 ENCOUNTER — Ambulatory Visit: Payer: Medicaid Other | Admitting: Advanced Practice Midwife

## 2018-03-04 ENCOUNTER — Encounter: Payer: Self-pay | Admitting: Advanced Practice Midwife

## 2018-03-04 VITALS — BP 133/82 | HR 82 | Wt 222.4 lb

## 2018-03-04 DIAGNOSIS — Z30431 Encounter for routine checking of intrauterine contraceptive device: Secondary | ICD-10-CM

## 2018-03-04 NOTE — Patient Instructions (Signed)
[facility] [place] [address], [place], [place] [address] [phone]

## 2018-03-04 NOTE — Progress Notes (Signed)
No charge IUD removal, needs hysteroscope  S: 22 y.o. G1P1001 [facility]. She desires IUD removal related to pain.  She reports ongoing daily cramping pain and significant pain with intercourse x 3 months. IUD was placed 03/05/17 by Dr Penne LashLeggett and was well tolerated at first with onset of pain more recently.  [facility] Dr Marice Potterove and her IUD strings were not visible. The IUD was seen on bedside US. The plan was for pt to [facility] for hysteroscope guided IUD removal by Dr Dolan AmenHarroway-Smith.  Per the pt, she never received a phone call and called [facility] office but did not hear from them. She was told by [facility] at a recent visit that [facility] an [facility]UD if strings were not visible so she called this morning and was scheduled in the afternoon clinic today.    O: BP 133/82   Pulse 82   Wt 222 lb 6.4 oz (100.9 kg)   BMI 38.17 kg/m   VS reviewed, nursing note reviewed,  Constitutional: well developed, well nourished, no distress HEENT: normocephalic CV: normal rate Pulm/chest wall: normal effort Abdomen: soft Neuro: alert and oriented x 3 Skin: warm, dry Psych: affect normal  A/P: 1. IUD check up --Offered to examine pt to look again for strings but notified her that I cannot remove IUD today if strings not visible.  Pt declines exam and wants to be scheduled when hysteroscope and MD are present for removal of IUD.   --Research officer, trade [employer] desk staff to contact [facility] to schedule IUD removal and call pt with appointment --Pt to f/u in [facility] if pain is severe or other gyn emergencies  Sharen CounterLisa Leftwich-Kirby, CNM 5:31 PM

## 2018-03-04 NOTE — Progress Notes (Signed)
Here for IUD removal due to causing pain during intercourse, psychological issues.

## 2018-03-05 ENCOUNTER — Telehealth: Payer: Self-pay

## 2018-03-05 NOTE — Telephone Encounter (Signed)
Patient called and made aware of first available hysteroscopy appointment.  Explained hysteroscopy to patient and answered questions. Instructed patient that we will send in a script for cytotec for her to use the night before procedure. Explained she will placed them in the vagina on 04-02-18 for her 04-03-18 procedure date. Patient also instructed to have a driver and she can take ibuprofen at lunch the day of her procedure. Patient states understanding. Abigail StammerJennifer Kyisha Fowle RNBSN

## 2018-04-03 ENCOUNTER — Ambulatory Visit (INDEPENDENT_AMBULATORY_CARE_PROVIDER_SITE_OTHER): Payer: Medicaid Other | Admitting: Obstetrics & Gynecology

## 2018-04-03 ENCOUNTER — Encounter: Payer: Self-pay | Admitting: Obstetrics & Gynecology

## 2018-04-03 VITALS — BP 124/76 | HR 97 | Wt 224.0 lb

## 2018-04-03 DIAGNOSIS — Z3202 Encounter for pregnancy test, result negative: Secondary | ICD-10-CM

## 2018-04-03 DIAGNOSIS — Z3009 Encounter for other general counseling and advice on contraception: Secondary | ICD-10-CM

## 2018-04-03 DIAGNOSIS — T8332XD Displacement of intrauterine contraceptive device, subsequent encounter: Secondary | ICD-10-CM | POA: Diagnosis not present

## 2018-04-03 DIAGNOSIS — Z01812 Encounter for preprocedural laboratory examination: Secondary | ICD-10-CM

## 2018-04-03 LAB — POCT URINE PREGNANCY: Preg Test, Ur: NEGATIVE

## 2018-04-03 MED ORDER — NORETHINDRONE ACET-ETHINYL EST 1.5-30 MG-MCG PO TABS: 1.0000 | ORAL_TABLET | Freq: Every day | ORAL | 11 refills | Status: AC

## 2018-04-03 NOTE — Progress Notes (Signed)
INDICATIONS: 22 y.o. G1P1001  here for scheduled surgery for hysteroscopy due to retained IUD.  Pt had attempted removal of ofc with no success. She reports pain for 2 months. She wants to restarts OCPs. She received a rx for OCPs from her doc but, the R has expired. She did get pregnant once on OCPs but, does not recall what bring She thinks it was Orthotricyclen lo.   Risks of surgery were discussed with the patient including but not limited to: bleeding which may require transfusion; infection which may require antibiotics; injury to uterus or surrounding organs; intrauterine scarring which may impair future fertility; need for additional procedures including laparotomy or laparoscopy; and other postoperative/anesthesia complications. Written informed consent was obtained.    FINDINGS:  Small uterus. Diffuse proliferative endometrium.  Did not see ostia  ANESTHESIA:   General, paracervical block. FLUID DEFICITS:  of Normal saline ESTIMATED BLOOD LOSS:  Less than 20 ml COMPLICATIONS:  None immediate.  PROCEDURE DETAILS:  The patient was taken to the procedure room where she received Motrin.  After an adequate timeout was performed, she was placed in the dorsal lithotomy position and examined; then prepped and draped in the sterile manner.   A speculum was then placed in the patient's vagina.  A paracervical block of 10cc of 2% lidocaine with epinephrine was placed with 5cc at both 5 and 7 o'clock.  A single tooth tenaculum was applied to the anterior lip of the cervix.   A 5mm hysteroscope was inserted under direct visualization using normal saline as a distending medium.  The IUD strings were noted at the level of the internal os.  There were no masses noted.  The tenaculum was removed from the anterior lip of the cervix and the vaginal speculum was removed after noting good hemostasis.  The patient tolerated the procedure well.   There were no immediate complications.  The patient will be  discharged to home. Routine postoperative instructions given.  She was prescribed LoEstrin 1/30  To take 1 po q day. [facility]   Hannalee Castor L. Harraway-Smith, M.D., [place]

## 2018-04-03 NOTE — Patient Instructions (Signed)
Hysteroscopy, Care After  Refer to this sheet in the next few weeks. These instructions provide you with information on caring for yourself after your procedure. Your health care provider may also give you more specific instructions. Your treatment has been planned according to current medical practices, but problems sometimes occur. Call your health care provider if you have any problems or questions after your procedure.  What can I expect after the procedure?  After your procedure, it is typical to have the following:  · You may have some cramping. This normally lasts for a couple days.  · You may have bleeding. This can vary from light spotting for a few days to menstrual-like bleeding for 3-7 days.    Follow these instructions at home:  · Rest for the first 1-2 days after the procedure.  · Only take over-the-counter or prescription medicines as directed by your health care provider. Do not take aspirin. It can increase the chances of bleeding.  · Take showers instead of baths for 2 weeks or as directed by your health care provider.  · Do not drive for 24 hours or as directed.  · Do not drink alcohol while taking pain medicine.  · Do not use tampons, douche, or have sexual intercourse for 2 weeks or until your health care provider says it is okay.  · Take your temperature twice a day for 4-5 days. Write it down each time.  · Follow your health care provider's advice about diet, exercise, and lifting.  · If you develop constipation, you may:  ? Take a mild laxative if your health care provider approves.  ? Add bran foods to your diet.  ? Drink enough fluids to keep your urine clear or pale yellow.  · Try to have someone with you or available to you for the first 24-48 hours, especially if you were given a general anesthetic.  · Follow up with your health care provider as directed.  Contact a health care provider if:  · You feel dizzy or lightheaded.  · You feel sick to your stomach (nauseous).  · You have  abnormal vaginal discharge.  · You have a rash.  · You have pain that is not controlled with medicine.  Get help right away if:  · You have bleeding that is heavier than a normal menstrual period.  · You have a fever.  · You have increasing cramps or pain, not controlled with medicine.  · You have new belly (abdominal) pain.  · You pass out.  · You have pain in the tops of your shoulders (shoulder strap areas).  · You have shortness of breath.  This information is not intended to replace advice given to you by your health care provider. Make sure you discuss any questions you have with your health care provider.  Document Released: 10/01/2013 Document Revised: 05/18/2016 Document Reviewed: 07/10/2013  Elsevier Interactive Patient Education © 2017 [employer]
